# Patient Record
Sex: Male | Born: 1955
Health system: Southern US, Community
[De-identification: ages and names within clinical notes are randomized; demographics above are authoritative.]

## PROBLEM LIST (undated history)

## (undated) DIAGNOSIS — G8929 Other chronic pain: Secondary | ICD-10-CM

## (undated) DIAGNOSIS — R51 Headache: Secondary | ICD-10-CM

## (undated) DIAGNOSIS — G4700 Insomnia, unspecified: Secondary | ICD-10-CM

## (undated) DIAGNOSIS — H9190 Unspecified hearing loss, unspecified ear: Secondary | ICD-10-CM

## (undated) DIAGNOSIS — C61 Malignant neoplasm of prostate: Secondary | ICD-10-CM

## (undated) DIAGNOSIS — M549 Dorsalgia, unspecified: Secondary | ICD-10-CM

## (undated) DIAGNOSIS — M1990 Unspecified osteoarthritis, unspecified site: Secondary | ICD-10-CM

## (undated) HISTORY — PX: FOOT SURGERY: SHX648

## (undated) HISTORY — PX: KNEE ARTHROSCOPY: SHX127

## (undated) HISTORY — PX: MANDIBLE FRACTURE SURGERY: SHX706

## (undated) HISTORY — PX: HERNIA REPAIR: SHX51

## (undated) HISTORY — PX: ELBOW SURGERY: SHX618

## (undated) HISTORY — PX: INSERTION PROSTATE RADIATION SEED: SUR718

---

## 1998-12-05 ENCOUNTER — Emergency Department (HOSPITAL_COMMUNITY): Admission: EM | Admit: 1998-12-05 | Discharge: 1998-12-05 | Payer: Self-pay

## 1999-11-26 ENCOUNTER — Emergency Department (HOSPITAL_COMMUNITY): Admission: EM | Admit: 1999-11-26 | Discharge: 1999-11-26 | Payer: Self-pay | Admitting: Emergency Medicine

## 1999-11-26 ENCOUNTER — Encounter: Payer: Self-pay | Admitting: Emergency Medicine

## 2000-01-24 ENCOUNTER — Ambulatory Visit (HOSPITAL_BASED_OUTPATIENT_CLINIC_OR_DEPARTMENT_OTHER): Admission: RE | Admit: 2000-01-24 | Discharge: 2000-01-24 | Payer: Self-pay | Admitting: Orthopedic Surgery

## 2002-01-27 ENCOUNTER — Encounter: Payer: Self-pay | Admitting: Neurology

## 2002-01-27 ENCOUNTER — Ambulatory Visit (HOSPITAL_COMMUNITY): Admission: RE | Admit: 2002-01-27 | Discharge: 2002-01-27 | Payer: Self-pay | Admitting: Neurology

## 2002-06-10 ENCOUNTER — Encounter: Payer: Self-pay | Admitting: Gastroenterology

## 2002-06-10 ENCOUNTER — Ambulatory Visit (HOSPITAL_COMMUNITY): Admission: RE | Admit: 2002-06-10 | Discharge: 2002-06-10 | Payer: Self-pay | Admitting: Gastroenterology

## 2002-06-18 ENCOUNTER — Emergency Department (HOSPITAL_COMMUNITY): Admission: EM | Admit: 2002-06-18 | Discharge: 2002-06-18 | Payer: Self-pay | Admitting: Emergency Medicine

## 2002-06-18 ENCOUNTER — Encounter: Payer: Self-pay | Admitting: Emergency Medicine

## 2002-07-08 ENCOUNTER — Ambulatory Visit (HOSPITAL_BASED_OUTPATIENT_CLINIC_OR_DEPARTMENT_OTHER): Admission: RE | Admit: 2002-07-08 | Discharge: 2002-07-08 | Payer: Self-pay | Admitting: Orthopedic Surgery

## 2003-08-01 ENCOUNTER — Encounter: Payer: Self-pay | Admitting: Emergency Medicine

## 2003-08-01 ENCOUNTER — Emergency Department (HOSPITAL_COMMUNITY): Admission: EM | Admit: 2003-08-01 | Discharge: 2003-08-01 | Payer: Self-pay | Admitting: Emergency Medicine

## 2003-10-26 ENCOUNTER — Emergency Department (HOSPITAL_COMMUNITY): Admission: EM | Admit: 2003-10-26 | Discharge: 2003-10-26 | Payer: Self-pay | Admitting: Emergency Medicine

## 2004-02-06 ENCOUNTER — Ambulatory Visit (HOSPITAL_COMMUNITY): Admission: RE | Admit: 2004-02-06 | Discharge: 2004-02-06 | Payer: Self-pay | Admitting: Emergency Medicine

## 2004-11-23 ENCOUNTER — Ambulatory Visit (HOSPITAL_COMMUNITY): Admission: RE | Admit: 2004-11-23 | Discharge: 2004-11-23 | Payer: Self-pay | Admitting: Gastroenterology

## 2005-01-04 ENCOUNTER — Encounter: Admission: RE | Admit: 2005-01-04 | Discharge: 2005-01-04 | Payer: Self-pay | Admitting: Neurology

## 2005-01-18 ENCOUNTER — Encounter: Admission: RE | Admit: 2005-01-18 | Discharge: 2005-01-18 | Payer: Self-pay | Admitting: Neurology

## 2005-03-16 ENCOUNTER — Encounter: Admission: RE | Admit: 2005-03-16 | Discharge: 2005-03-16 | Payer: Self-pay | Admitting: Internal Medicine

## 2005-04-09 ENCOUNTER — Emergency Department (HOSPITAL_COMMUNITY): Admission: EM | Admit: 2005-04-09 | Discharge: 2005-04-09 | Payer: Self-pay | Admitting: Emergency Medicine

## 2005-04-10 ENCOUNTER — Other Ambulatory Visit: Admission: RE | Admit: 2005-04-10 | Discharge: 2005-04-10 | Payer: Self-pay | Admitting: General Surgery

## 2006-05-02 ENCOUNTER — Ambulatory Visit: Payer: Self-pay | Admitting: Physical Medicine & Rehabilitation

## 2006-05-02 ENCOUNTER — Encounter
Admission: RE | Admit: 2006-05-02 | Discharge: 2006-07-31 | Payer: Self-pay | Admitting: Physical Medicine & Rehabilitation

## 2006-05-20 ENCOUNTER — Encounter
Admission: RE | Admit: 2006-05-20 | Discharge: 2006-08-18 | Payer: Self-pay | Admitting: Physical Medicine & Rehabilitation

## 2006-10-31 ENCOUNTER — Emergency Department (HOSPITAL_COMMUNITY): Admission: EM | Admit: 2006-10-31 | Discharge: 2006-10-31 | Payer: Self-pay | Admitting: Emergency Medicine

## 2006-11-10 ENCOUNTER — Encounter: Admission: RE | Admit: 2006-11-10 | Discharge: 2006-11-10 | Payer: Self-pay | Admitting: Orthopedic Surgery

## 2007-12-29 ENCOUNTER — Emergency Department (HOSPITAL_COMMUNITY): Admission: EM | Admit: 2007-12-29 | Discharge: 2007-12-29 | Payer: Self-pay | Admitting: Emergency Medicine

## 2008-09-07 ENCOUNTER — Ambulatory Visit: Admission: RE | Admit: 2008-09-07 | Discharge: 2008-09-21 | Payer: Self-pay | Admitting: Radiation Oncology

## 2008-11-10 ENCOUNTER — Ambulatory Visit: Admission: RE | Admit: 2008-11-10 | Discharge: 2009-02-08 | Payer: Self-pay | Admitting: Radiation Oncology

## 2008-11-18 ENCOUNTER — Encounter: Admission: RE | Admit: 2008-11-18 | Discharge: 2008-11-18 | Payer: Self-pay | Admitting: Urology

## 2008-12-20 ENCOUNTER — Ambulatory Visit (HOSPITAL_BASED_OUTPATIENT_CLINIC_OR_DEPARTMENT_OTHER): Admission: RE | Admit: 2008-12-20 | Discharge: 2008-12-20 | Payer: Self-pay | Admitting: Urology

## 2009-03-10 ENCOUNTER — Emergency Department (HOSPITAL_COMMUNITY): Admission: EM | Admit: 2009-03-10 | Discharge: 2009-03-10 | Payer: Self-pay | Admitting: Emergency Medicine

## 2009-11-24 ENCOUNTER — Ambulatory Visit (HOSPITAL_COMMUNITY): Admission: RE | Admit: 2009-11-24 | Discharge: 2009-11-24 | Payer: Self-pay | Admitting: Gastroenterology

## 2009-12-28 ENCOUNTER — Ambulatory Visit (HOSPITAL_BASED_OUTPATIENT_CLINIC_OR_DEPARTMENT_OTHER): Admission: RE | Admit: 2009-12-28 | Discharge: 2009-12-28 | Payer: Self-pay | Admitting: Orthopedic Surgery

## 2010-12-24 ENCOUNTER — Encounter: Payer: Self-pay | Admitting: Neurology

## 2011-02-19 LAB — POCT HEMOGLOBIN-HEMACUE: Hemoglobin: 15.5 g/dL (ref 13.0–17.0)

## 2011-03-19 LAB — COMPREHENSIVE METABOLIC PANEL
ALT: 24 U/L (ref 0–53)
AST: 28 U/L (ref 0–37)
Albumin: 4.3 g/dL (ref 3.5–5.2)
Alkaline Phosphatase: 64 U/L (ref 39–117)
BUN: 9 mg/dL (ref 6–23)
CO2: 30 mEq/L (ref 19–32)
Calcium: 9.6 mg/dL (ref 8.4–10.5)
Chloride: 105 mEq/L (ref 96–112)
Creatinine, Ser: 0.97 mg/dL (ref 0.4–1.5)
GFR calc Af Amer: >60 mL/min (ref >60)
GFR calc non Af Amer: >60 mL/min (ref >60)
Glucose, Bld: 142 mg/dL — ABNORMAL HIGH (ref 70–99)
Potassium: 4.4 mEq/L (ref 3.5–5.1)
Sodium: 140 mEq/L (ref 135–145)
Total Bilirubin: 0.8 mg/dL (ref 0.3–1.2)
Total Protein: 6.5 g/dL (ref 6.0–8.3)

## 2011-03-19 LAB — CBC
HCT: 44.8 % (ref 39.0–52.0)
Hemoglobin: 15.3 g/dL (ref 13.0–17.0)
MCHC: 34.1 g/dL (ref 30.0–36.0)
MCV: 88.6 fL (ref 78.0–100.0)
Platelets: 220 10*3/uL (ref 150–400)
RBC: 5.05 MIL/uL (ref 4.22–5.81)
RDW: 13.9 % (ref 11.5–15.5)
WBC: 5.6 10*3/uL (ref 4.0–10.5)

## 2011-03-19 LAB — PROTIME-INR
INR: 1 (ref 0.00–1.49)
Prothrombin Time: 13.4 seconds (ref 11.6–15.2)

## 2011-03-19 LAB — APTT: aPTT: 30 seconds (ref 24–37)

## 2011-04-17 NOTE — Op Note (Signed)
NAME:  Garrett Wilson, QUAM NO.:  [id number]   MEDICAL RECORD NO.:  [id number]          PATIENT TYPE:  AMB   LOCATION:  [facility]                         FACILITY:  [facility]   PHYSICIAN:  Valetta Fuller, M.D.  DATE OF BIRTH:  04-Dec-1955   DATE OF PROCEDURE:  12/20/2008  DATE OF DISCHARGE:                               OPERATIVE REPORT   PREOPERATIVE DIAGNOSIS:  Favorable clinical stage T1c adenocarcinoma of  the prostate.   POSTOPERATIVE DIAGNOSIS:  Favorable clinical stage T1c adenocarcinoma of  the prostate.   PROCEDURE PERFORMED:  I-125 seed implantation via Nucletron robotic  implanter and flexible cystoscopy.   SURGEON:  Valetta Fuller, M.D.   ASSISTANT:  Maryln Gottron, M.D.   ANESTHESIA:  General.   INDICATIONS:  [name] is a 55 year old male who was originally  diagnosed with favorable adenocarcinoma of the prostate in late 2009.  The patient has a family history of prostate cancer.  The patient's PSA  was only 1.7, but had shown a trend upward.  He was diagnosed with low-  volume Gleason 3 + 3 = 6 cancer.  The patient underwent extensive  consultation with myself and also Maryln Gottron, M.D. about treatment  options which included active surveillance, radiation therapy or robotic  surgery.  After much discussion the patient chose a seed implantation  and appeared to understand the advantages, as well as potential  disadvantages and complications of such treatment.  The patient now  presents for his procedure.  Preoperative voiding symptoms are minimal  with some moderate erectile dysfunction.  Prostate size only about 20  grams.   TECHNIQUE AND FINDINGS:  The patient was brought to the operating room  where he had successful induction of general anesthesia.  He received  perioperative ciprofloxacin.  He was placed in mid lithotomy position.  Transrectal ultrasound probe was inserted with an anchor stand.  Prostate was also anchored with anchoring  needles.  The initial portion  of procedure was performed by radiation oncology to include real-time  contouring of the prostate, urethra and rectum.  A real-time dose was  then established.  The reference dose was 145 gray.  Once the plan was  in place we came into the operating room.  Each needle was placed with  real-time sagittal ultrasound guidance.  Seed implantation was done by  [employer].  A total of 29 needles were utilized with  69 seeds implanted.  The fluoroscopic images after the procedure showed  excellent distribution of the seeds.  The patient's Foley catheter was  removed and flexible cystoscopy was performed.  No seeds were noted in  the urethra or bladder.  A new Foley catheter was placed and the patient  was brought to recovery room in stable condition.      Valetta Fuller, M.D.  Electronically Signed    DSG/MEDQ  D:  12/21/2008  T:  12/21/2008  Job:  [id number]

## 2011-04-20 NOTE — Procedures (Signed)
[facility]  Patient:    [name], [name] Visit Number: [id number] MRN: [id number]          Service Type: END Location: ENDO Attending Physician:  Dennison Bulla Ii Dictated by:   Verlin Grills, M.D. Proc. Date: 06/10/02 Admit Date:  06/10/2002 Discharge Date: 06/10/2002   CC:         Thora Lance, M.D.                           Procedure Report  PROCEDURE:  Esophagogastroduodenoscopy.  REFERRING PHYSICIAN:  Thora Lance, M.D.  INDICATION FOR PROCEDURE:  Garrett Wilson is a 55 year old male born 1956/06/03. Garrett Wilson is undergoing diagnostic esophagogastroduodenoscopy with possible Savary esophageal dilation to evaluate dysphagia.  ENDOSCOPIST:  Verlin Grills, M.D.  PREMEDICATION:  Versed 10 mg, Demerol 50 mg.  ENDOSCOPE:  Olympus gastroscope.  DESCRIPTION OF PROCEDURE:  After obtaining informed consent, Garrett Wilson was placed in the left lateral decubitus position. I administered intravenous Demerol and intravenous Versed to achieve conscious sedation for the procedure. The patients cardiac rhythm, oxygen saturation and blood pressure were monitored throughout the procedure and documented in the medical record.  The Olympus gastroscope was passed through the posterior hypopharynx into the proximal esophagus without difficulty. The hypopharynx, larynx, and vocal chords appeared normal.  ESOPHAGOSCOPY:  The proximal, mid and lower segments of the esophagus appeared normal. Endoscopically there is no evidence for the presence of esophageal mucosal scarring, Barretts esophagus, erosive esophagitis, esophageal stricture formation, esophageal obstruction or esophageal ulceration.  GASTROSCOPY:  Garrett Wilson does have a small hiatal hernia. Retroflexed view of the gastric cardia and fundus was normal. The diaphragmatic hiatus is moderately patulous. The gastric body, antrum and pylorus appeared  normal.  DUODENOSCOPY:  The duodenal bulb and descending duodenum appeared normal.  ASSESSMENT:  Small hiatal hernia with moderately patulous diaphragmatic hiatus; no signs of esophageal mucosal injury, Barretts esophagus, or esophageal stricture formation.  RECOMMENDATIONS:  I suspect Garrett Wilson has esophageal dismotility causing his intermittent dysphagia. I recommend to him to try proton pump inhibitor therapy on a daily basis to determine if acid reflux is contributing to his probable esophageal dismotility. Dictated by:   Verlin Grills, M.D. Attending Physician:  Dennison Bulla Ii DD:  06/10/02 TD:  06/13/02 Job: [id number]
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
## 2011-04-20 NOTE — Op Note (Signed)
[place]  Patient:    Garrett Wilson, Garrett Wilson                   MRN: [id number] Proc. Date: 01/24/00 Adm. Date:  [id number] Attending:  Marlowe Shores                           Operative Report  PREOPERATIVE DIAGNOSIS:  Right elbow loose body.  POSTOPERATIVE DIAGNOSIS:  Right elbow loose body.  PROCEDURE:  Right elbow arthroscopy with removal of loose body and abrasion chondroplasty articular defect radial head.  SURGEON:  Artist Pais. Mina Marble, M.D.  ASSISTANT:  Nicki Reaper, M.D.  ANESTHESIA:  General.  TOURNIQUET TIME:  1 hour and 28 minutes.  COMPLICATIONS:  None.  DRAINS:  None.  OPERATIVE REPORT:  Patient was taken to operating room.  After the induction of  adequate general anesthesia, was placed prone on the operating room bed and his  right arm was then placed in the elbow arthroscopy positioner with bony prominences well-padded and then his right upper extremity was prepped and draped in usual sterile fashion.  Esmarch was used to exsanguinate the limb and the tourniquet as inflated to 250 mmHg.  At this point and time, a standard medial arthroscopic portal was established after 20 cc of normal saline was injected through the lateral aspect of the elbow joint.  A 15 blade was used to incise the skin 2 cm  proximal to the medial epicondyle anterior to the intermuscular septum and the trocar was then felt to skid along the anterior aspect of the humerus until the  joint was penetrated.  The scope was then introduced into the joint and visualization of the joint showed the anterior aspect of the radial head, the trochlea and the capitellum.  Inspection of the radial head showed a large articular defect with soft cartilage approximately 1 cm in length and 1 cm in diameter along the anterior lip.  A standard lateral working portal was established using an inside-out technique, the scope was placed to the anterior aspect  of the capsule anterior to the radial head and then using a switching stick, it was pushed through the capsule and out through the skin, a small blade was used to incise he skin, switching stick was then drawn through the lateral skin and a small lateral working portal was established.  The 3.5 suction shaver was then placed into the joint from the lateral working portal and a synovectomy was performed along the  gutter along the radial head and neck.  Next, the articular defect that had been previously identified was debrided down to a cancellous base, which was decorticated to bleeding bone and using a 4.0 suction shaver. At this point in time, the Arthrocare wand was introduced through the lateral portal for streaking and ablation of hypertrophic synovium.  Next, an anterior olecranon-type portal was established lateral and in the area of the olecranon fossa using an 18-gauge needle, followed by a 15 blade to incise the skin.  The scope was inserted into  this area and visualization of the posterior aspect of the joint showed a large  loose body.  The loose body was identified through the skin with 22-gauge needle and then removed through a separate stab incision using a small grasping forcep. The wounds were thoroughly irrigated.  The portals were then closed with 4-0 nylon and 15 cc of 0.25% Marcaine with epinephrine was injected  in the joint for postoperative pain control.  Sterile dressing of Xeroform, 4 x 4s, fluffs and a  compressive wrap was applied.  The patient tolerated the procedure well and went to recovery room in stable fashion. DD:  01/24/00 TD:  01/24/00 Job: [id number]
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
## 2011-04-20 NOTE — Op Note (Signed)
NAME:  Garrett Wilson, Garrett Wilson NO.:  [id number]   MEDICAL RECORD NO.:  [id number]                    PATIENT TYPE:   LOCATION:                                       FACILITY:   PHYSICIAN:  Harvie Junior, M.D.                DATE OF BIRTH:   DATE OF PROCEDURE:  07/08/2002  DATE OF DISCHARGE:                                 OPERATIVE REPORT   PREOPERATIVE DIAGNOSIS:  Degenerative joint disease with questionable  anterior cruciate ligament tear.   POSTOPERATIVE DIAGNOSIS:  Degenerative joint disease with questionable  anterior cruciate ligament tear.   PROCEDURES:  1. Examination under anesthesia and arthroscopy.  2. Debridement of lateral femoral condyle with partial lateral meniscectomy.  3. Debridement of chondromalacia of patella and femoral trochlea.   SURGEON:  Harvie Junior, M.D.   ASSISTANT:  Currie Paris. Thedore Mins.   ANESTHESIA:  General.   BRIEF HISTORY:  A 55 year old male with a long history of having  degenerative joint disease bilaterally.  He ultimately had a previous  arthroscopy which showed that he had some degenerative joint disease and a  known ACL tear.  We discussed preoperatively the possibility of doing an ACL  reconstruction but ultimately felt that we would examine him under  anesthesia to know what the appropriate course of action was.  The patient  was brought to the operating room for evaluation and this procedure.   DESCRIPTION OF PROCEDURE:  The patient was brought to the operating room and  after adequate anesthesia was obtained with a general anesthetic, the  patient was placed upon the operating table.  The right leg was then  examined under anesthesia.  He had about a 10 degree flexion contracture on  that side, and this did not resolve under anesthesia.  We tried to push him  into full extension and he really could not go straight.  It was clear at  that point that he did have a positive Lachman and because of his  positive  Lachman, I did send for the ACL graft although I told them not to open it.  He did not have a pivot shift, I think mostly because he could not be  brought into full extension.  My concern was that if he had something that  was locking the knee and we removed this impediment, that he may then start  to have a positive pivot shift.  At any rate, at this time the patient was  prepped and draped and a normal arthroscopy was performed.  At arthroscopy  he did have some grade 2 and 3 changes in the patellofemoral joint under the  patella and the femoral trochlea.  This was debrided.  We then turned to the  medial side, where he was noted to have an old what looked like a bucket  handle resection with a little bit of fray of the posterior horn  medial  meniscus, not dramatic.  The medial femoral condyle showed maybe some grade  1 change but nothing significant.  The anterior cruciate ligament was  present although attenuated and did under Lachman testing tighten, but it  was certainly a very weak presentation.  At that point it was still unclear  exactly what we would do, and we ultimately felt that we went over laterally  to see what was going on there.  The lateral tibial plateau had some grade 2  change and finally we did find some significant grade 4 change on the  lateral femoral condyle.  This was over a large area, probably 4 x 4 sq. cm  area.  Given the significant degenerative change in the lateral compartment,  the patient's age of 55 years, and the really inability to pivot shift this  patient under anesthesia, it was felt that ACL reconstruction was not  appropriate at this point and it was not undertaken.  The knee was copiously  irrigated.  All  loose and fragmented pieces were debrided.  At this point the knee was  copiously irrigated and suctioned dry.  A sterile compressive dressing was  applied and the patient taken to recovery, where he was noted to be in  satisfactory  condition.  Estimated blood loss for the procedure was none.                                               Harvie Junior, M.D.    Ranae Plumber  D:  07/08/2002  T:  07/12/2002  Job:  [id number]

## 2011-04-20 NOTE — Op Note (Signed)
NAME:  MANJOT, HINKS NO.:  [id number]   MEDICAL RECORD NO.:  [id number]          PATIENT TYPE:  AMB   LOCATION:  ENDO                         FACILITY:  [facility]   PHYSICIAN:  Danise Edge, M.D.   DATE OF BIRTH:  Aug 27, 1956   DATE OF PROCEDURE:  11/23/2004  DATE OF DISCHARGE:                                 OPERATIVE REPORT   PROCEDURE:  Screening colonoscopy.   INDICATIONS FOR PROCEDURE:  Mr. Rusty Villella is a 55 year old male born  30-Sep-1956.  Mr. Musial is scheduled to undergo his first  screening colonoscopy with polypectomy to prevent colon cancer.   ENDOSCOPIST:  Danise Edge, M.D.   PREMEDICATION:  Versed 10 mg, Demerol 50 mg.   DESCRIPTION OF PROCEDURE:  After obtaining informed consent, Mr. Heaphy  was placed in the left lateral decubitus position.  I administered  intravenous Demerol and intravenous Versed to achieve conscious sedation for  the procedure.  The patient's blood pressure, oxygen saturation, and cardiac  rhythm were monitored throughout the procedure and documented in the medical  record.   Anal inspection and digital rectal exam were normal.  The prostate was  nonnodular.  The Olympus adjustable pediatric colonoscope was introduced  into the rectum and advanced to the cecum.  The colonic preparation for the  exam today was excellent.   Rectum:  Normal.   Sigmoid colon and descending colon:  Normal.   Splenic flexure:  Normal.   Transverse colon:  Normal.   Hepatic flexure:  Normal.   Ascending colon:  Normal.   Cecum and ileocecal valve:  Normal.   ASSESSMENT:  Normal screening proctocolonoscopy to the cecum.      MJ/MEDQ  D:  11/23/2004  T:  11/23/2004  Job:  [id number]   cc:   Georgann Housekeeper, MD  [address]  [place]  [place] [address]  Fax: [phone]   Kerry Kass, M.D. [facility]

## 2011-06-19 ENCOUNTER — Emergency Department (HOSPITAL_BASED_OUTPATIENT_CLINIC_OR_DEPARTMENT_OTHER)
Admission: EM | Admit: 2011-06-19 | Discharge: 2011-06-19 | Disposition: A | Discharge status: Home / Self Care | Payer: No Typology Code available for payment source | Attending: Emergency Medicine | Admitting: Emergency Medicine

## 2011-06-19 ENCOUNTER — Encounter: Payer: Self-pay | Admitting: Student

## 2011-06-19 DIAGNOSIS — M546 Pain in thoracic spine: Secondary | ICD-10-CM | POA: Insufficient documentation

## 2011-06-19 DIAGNOSIS — R51 Headache: Secondary | ICD-10-CM | POA: Insufficient documentation

## 2011-06-19 DIAGNOSIS — S335XXA Sprain of ligaments of lumbar spine, initial encounter: Secondary | ICD-10-CM | POA: Insufficient documentation

## 2011-06-19 DIAGNOSIS — M545 Low back pain, unspecified: Secondary | ICD-10-CM | POA: Insufficient documentation

## 2011-06-19 DIAGNOSIS — Z8546 Personal history of malignant neoplasm of prostate: Secondary | ICD-10-CM | POA: Insufficient documentation

## 2011-06-19 DIAGNOSIS — S39012A Strain of muscle, fascia and tendon of lower back, initial encounter: Secondary | ICD-10-CM

## 2011-06-19 DIAGNOSIS — Y92410 Unspecified street and highway as the place of occurrence of the external cause: Secondary | ICD-10-CM | POA: Insufficient documentation

## 2011-06-19 HISTORY — DX: Insomnia, unspecified: G47.00

## 2011-06-19 HISTORY — DX: Malignant neoplasm of prostate: C61

## 2011-06-19 MED ORDER — IBUPROFEN 800 MG PO TABS: 800.0000 mg | ORAL_TABLET | Freq: Three times a day (TID) | ORAL | Status: AC

## 2011-06-19 MED ORDER — IBUPROFEN 800 MG PO TABS: 800.0000 mg | ORAL_TABLET | Freq: Three times a day (TID) | ORAL | Status: DC

## 2011-06-19 MED ORDER — IBUPROFEN 800 MG PO TABS
800.0000 mg | ORAL_TABLET | Freq: Once | ORAL | Status: AC
  Administered 2011-06-19: 800 mg via ORAL
  Filled 2011-06-19: qty 1
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
## 2011-06-21 NOTE — ED Provider Notes (Signed)
Evaluation and management procedures were performed by the PA/NP under my supervision/collaboration.   Dione Booze, MD 06/21/11 [phone]

## 2012-03-17 DIAGNOSIS — L723 Sebaceous cyst: Secondary | ICD-10-CM | POA: Diagnosis not present

## 2012-05-19 DIAGNOSIS — C61 Malignant neoplasm of prostate: Secondary | ICD-10-CM | POA: Diagnosis not present

## 2012-05-20 DIAGNOSIS — C61 Malignant neoplasm of prostate: Secondary | ICD-10-CM | POA: Diagnosis not present

## 2012-05-20 DIAGNOSIS — N529 Male erectile dysfunction, unspecified: Secondary | ICD-10-CM | POA: Diagnosis not present

## 2012-05-20 DIAGNOSIS — M6740 Ganglion, unspecified site: Secondary | ICD-10-CM | POA: Diagnosis not present

## 2012-06-11 DIAGNOSIS — M549 Dorsalgia, unspecified: Secondary | ICD-10-CM | POA: Diagnosis not present

## 2012-06-11 DIAGNOSIS — Z79899 Other long term (current) drug therapy: Secondary | ICD-10-CM | POA: Diagnosis not present

## 2012-06-12 DIAGNOSIS — M25539 Pain in unspecified wrist: Secondary | ICD-10-CM | POA: Diagnosis not present

## 2012-06-16 ENCOUNTER — Encounter (HOSPITAL_BASED_OUTPATIENT_CLINIC_OR_DEPARTMENT_OTHER): Payer: Self-pay | Admitting: *Deleted

## 2012-06-16 NOTE — Progress Notes (Signed)
No ht or resp problems No lab needed

## 2012-06-18 ENCOUNTER — Ambulatory Visit (HOSPITAL_BASED_OUTPATIENT_CLINIC_OR_DEPARTMENT_OTHER): Payer: Medicare Other | Admitting: Anesthesiology

## 2012-06-18 ENCOUNTER — Encounter (HOSPITAL_BASED_OUTPATIENT_CLINIC_OR_DEPARTMENT_OTHER): Payer: Self-pay | Admitting: Anesthesiology

## 2012-06-18 ENCOUNTER — Ambulatory Visit (HOSPITAL_BASED_OUTPATIENT_CLINIC_OR_DEPARTMENT_OTHER)
Admission: RE | Admit: 2012-06-18 | Discharge: 2012-06-18 | Disposition: A | Discharge status: Home / Self Care | Payer: Medicare Other | Source: Ambulatory Visit | Attending: Orthopedic Surgery | Admitting: Orthopedic Surgery

## 2012-06-18 ENCOUNTER — Encounter (HOSPITAL_BASED_OUTPATIENT_CLINIC_OR_DEPARTMENT_OTHER)
Admission: RE | Disposition: A | Discharge status: Home / Self Care | Payer: Self-pay | Source: Ambulatory Visit | Attending: Orthopedic Surgery

## 2012-06-18 DIAGNOSIS — M6740 Ganglion, unspecified site: Secondary | ICD-10-CM | POA: Diagnosis not present

## 2012-06-18 DIAGNOSIS — Z8546 Personal history of malignant neoplasm of prostate: Secondary | ICD-10-CM | POA: Insufficient documentation

## 2012-06-18 HISTORY — DX: Other chronic pain: G89.29

## 2012-06-18 HISTORY — DX: Dorsalgia, unspecified: M54.9

## 2012-06-18 HISTORY — DX: Unspecified hearing loss, unspecified ear: H91.90

## 2012-06-18 HISTORY — PX: GANGLION CYST EXCISION: SHX1691

## 2012-06-18 HISTORY — DX: Headache: R51

## 2012-06-18 HISTORY — DX: Unspecified osteoarthritis, unspecified site: M19.90

## 2012-06-18 SURGERY — EXCISION, GANGLION CYST, WRIST
Anesthesia: General | Site: Wrist | Laterality: Right | Wound class: Clean

## 2012-06-18 MED ORDER — HYDROCODONE-ACETAMINOPHEN 5-500 MG PO TABS: 1.0000 | ORAL_TABLET | Freq: Four times a day (QID) | ORAL | Status: AC | PRN

## 2012-06-18 MED ORDER — MIDAZOLAM HCL 5 MG/5ML IJ SOLN
INTRAMUSCULAR | Status: DC | PRN
  Administered 2012-06-18: 2 mg via INTRAVENOUS

## 2012-06-18 MED ORDER — CEFAZOLIN SODIUM 1-5 GM-% IV SOLN
INTRAVENOUS | Status: DC | PRN
  Administered 2012-06-18: 1 g via INTRAVENOUS

## 2012-06-18 MED ORDER — METOCLOPRAMIDE HCL 5 MG/ML IJ SOLN: 10.0000 mg | Freq: Once | INTRAMUSCULAR | Status: DC | PRN

## 2012-06-18 MED ORDER — LACTATED RINGERS IV SOLN
INTRAVENOUS | Status: DC
  Administered 2012-06-18 (×2): via INTRAVENOUS

## 2012-06-18 MED ORDER — BUPIVACAINE HCL (PF) 0.5 % IJ SOLN
INTRAMUSCULAR | Status: DC | PRN
  Administered 2012-06-18: 6 mL

## 2012-06-18 MED ORDER — DEXAMETHASONE SODIUM PHOSPHATE 4 MG/ML IJ SOLN
INTRAMUSCULAR | Status: DC | PRN
  Administered 2012-06-18: 10 mg via INTRAVENOUS

## 2012-06-18 MED ORDER — FENTANYL CITRATE 0.05 MG/ML IJ SOLN
INTRAMUSCULAR | Status: DC | PRN
  Administered 2012-06-18: 250 ug via INTRAVENOUS
  Administered 2012-06-18: 50 ug via INTRAVENOUS

## 2012-06-18 MED ORDER — METOCLOPRAMIDE HCL 5 MG/ML IJ SOLN
INTRAMUSCULAR | Status: DC | PRN
  Administered 2012-06-18: 10 mg via INTRAVENOUS

## 2012-06-18 MED ORDER — OXYCODONE HCL 5 MG PO TABS: 5.0000 mg | ORAL_TABLET | Freq: Once | ORAL | Status: DC | PRN

## 2012-06-18 MED ORDER — FENTANYL CITRATE 0.05 MG/ML IJ SOLN: 25.0000 ug | INTRAMUSCULAR | Status: DC | PRN

## 2012-06-18 SURGICAL SUPPLY — 55 items
APL SKNCLS STERI-STRIP NONHPOA (GAUZE/BANDAGES/DRESSINGS) ×1
BANDAGE ELASTIC 3 VELCRO ST LF (GAUZE/BANDAGES/DRESSINGS) ×2 IMPLANT
BANDAGE GAUZE ELAST BULKY 4 IN (GAUZE/BANDAGES/DRESSINGS) IMPLANT
BANDAGE GAUZE STRT 1 STR LF (GAUZE/BANDAGES/DRESSINGS) IMPLANT
BENZOIN TINCTURE PRP APPL 2/3 (GAUZE/BANDAGES/DRESSINGS) ×1 IMPLANT
BLADE MINI RND TIP GREEN BEAV (BLADE) IMPLANT
BLADE SURG 15 STRL LF DISP TIS (BLADE) ×1 IMPLANT
BLADE SURG 15 STRL SS (BLADE) ×2
BNDG CMPR 9X4 STRL LF SNTH (GAUZE/BANDAGES/DRESSINGS)
BNDG ESMARK 4X9 LF (GAUZE/BANDAGES/DRESSINGS) IMPLANT
CANISTER SUCTION 1200CC (MISCELLANEOUS) IMPLANT
CLOTH BEACON ORANGE TIMEOUT ST (SAFETY) ×2 IMPLANT
COVER MAYO STAND STRL (DRAPES) ×2 IMPLANT
COVER TABLE BACK 60X90 (DRAPES) ×2 IMPLANT
CUFF TOURNIQUET SINGLE 18IN (TOURNIQUET CUFF) ×1 IMPLANT
DECANTER SPIKE VIAL GLASS SM (MISCELLANEOUS) IMPLANT
DRAPE EXTREMITY T 121X128X90 (DRAPE) ×2 IMPLANT
DRSG EMULSION OIL 3X3 NADH (GAUZE/BANDAGES/DRESSINGS) ×1 IMPLANT
DURAPREP 26ML APPLICATOR (WOUND CARE) ×2 IMPLANT
ELECT NDL TIP 2.8 STRL (NEEDLE) IMPLANT
ELECT NEEDLE TIP 2.8 STRL (NEEDLE) ×2 IMPLANT
ELECT REM PT RETURN 9FT ADLT (ELECTROSURGICAL) ×2
ELECTRODE REM PT RTRN 9FT ADLT (ELECTROSURGICAL) IMPLANT
GLOVE BIOGEL PI IND STRL 8 (GLOVE) ×2 IMPLANT
GLOVE BIOGEL PI INDICATOR 8 (GLOVE) ×2
GLOVE ECLIPSE 7.5 STRL STRAW (GLOVE) ×4 IMPLANT
GLOVE SKINSENSE NS SZ7.0 (GLOVE) ×1
GLOVE SKINSENSE STRL SZ7.0 (GLOVE) IMPLANT
GOWN BRE IMP PREV XXLGXLNG (GOWN DISPOSABLE) ×1 IMPLANT
GOWN PREVENTION PLUS XLARGE (GOWN DISPOSABLE) ×3 IMPLANT
GOWN PREVENTION PLUS XXLARGE (GOWN DISPOSABLE) ×2 IMPLANT
NDL HYPO 25X1 1.5 SAFETY (NEEDLE) IMPLANT
NEEDLE HYPO 25X1 1.5 SAFETY (NEEDLE) ×2 IMPLANT
NS IRRIG 1000ML POUR BTL (IV SOLUTION) ×2 IMPLANT
PACK BASIN DAY SURGERY FS (CUSTOM PROCEDURE TRAY) ×2 IMPLANT
PAD CAST 3X4 CTTN HI CHSV (CAST SUPPLIES) ×1 IMPLANT
PADDING CAST ABS 4INX4YD NS (CAST SUPPLIES) ×1
PADDING CAST ABS COTTON 4X4 ST (CAST SUPPLIES) ×1 IMPLANT
PADDING CAST COTTON 3X4 STRL (CAST SUPPLIES) ×2
PENCIL BUTTON HOLSTER BLD 10FT (ELECTRODE) ×1 IMPLANT
SPLINT PLASTER CAST XFAST 3X15 (CAST SUPPLIES) IMPLANT
SPLINT PLASTER XTRA FASTSET 3X (CAST SUPPLIES) ×5
SPONGE GAUZE 4X4 12PLY (GAUZE/BANDAGES/DRESSINGS) ×2 IMPLANT
STOCKINETTE 4X48 STRL (DRAPES) ×2 IMPLANT
STRIP CLOSURE SKIN 1/2X4 (GAUZE/BANDAGES/DRESSINGS) ×1 IMPLANT
SUCTION FRAZIER TIP 10 FR DISP (SUCTIONS) IMPLANT
SUT ETHILON 4 0 P 3 18 (SUTURE) ×1 IMPLANT
SUT MON AB 4-0 PC3 18 (SUTURE) ×2 IMPLANT
SYR BULB 3OZ (MISCELLANEOUS) ×2 IMPLANT
SYR CONTROL 10ML LL (SYRINGE) ×1 IMPLANT
TOWEL OR 17X24 6PK STRL BLUE (TOWEL DISPOSABLE) ×4 IMPLANT
TOWEL OR NON WOVEN STRL DISP B (DISPOSABLE) ×2 IMPLANT
TUBE CONNECTING 20X1/4 (TUBING) IMPLANT
UNDERPAD 30X30 INCONTINENT (UNDERPADS AND DIAPERS) ×2 IMPLANT
WATER STERILE IRR 1000ML POUR (IV SOLUTION) ×1 IMPLANT

## 2012-06-18 NOTE — Anesthesia Preprocedure Evaluation (Signed)
Anesthesia Evaluation  Patient identified by MRN, date of birth, ID band Patient awake    Reviewed: Allergy & Precautions, H&P , NPO status , Patient's Chart, lab work & pertinent test results, reviewed documented beta blocker date and time   Airway Mallampati: II TM Distance: >3 FB Neck ROM: full    Dental   Pulmonary neg pulmonary ROS,          Cardiovascular negative cardio ROS      Neuro/Psych  Headaches, negative psych ROS   GI/Hepatic negative GI ROS, Neg liver ROS,   Endo/Other  negative endocrine ROS  Renal/GU negative Renal ROS  negative genitourinary   Musculoskeletal   Abdominal   Peds  Hematology negative hematology ROS (+)   Anesthesia Other Findings See surgeon's H&P   Reproductive/Obstetrics negative OB ROS                           Anesthesia Physical Anesthesia Plan  ASA: II  Anesthesia Plan: General   Post-op Pain Management:    Induction: Intravenous  Airway Management Planned: LMA  Additional Equipment:   Intra-op Plan:   Post-operative Plan: Extubation in OR  Informed Consent: I have reviewed the patients History and Physical, chart, labs and discussed the procedure including the risks, benefits and alternatives for the proposed anesthesia with the patient or authorized representative who has indicated his/her understanding and acceptance.   Dental Advisory Given  Plan Discussed with: CRNA and Surgeon  Anesthesia Plan Comments:         Anesthesia Quick Evaluation

## 2012-06-18 NOTE — Anesthesia Procedure Notes (Signed)
Procedure Name: LMA Insertion Date/Time: 06/18/2012 1:18 PM Performed by: Gar Gibbon Pre-anesthesia Checklist: Patient identified, Emergency Drugs available, Suction available and Patient being monitored Patient Re-evaluated:Patient Re-evaluated prior to inductionOxygen Delivery Method: Circle System Utilized Preoxygenation: Pre-oxygenation with 100% oxygen Intubation Type: IV induction Ventilation: Mask ventilation without difficulty LMA: LMA inserted LMA Size: 5.0 Number of attempts: 1 Airway Equipment and Method: bite block Placement Confirmation: positive ETCO2 Tube secured with: Tape Dental Injury: Teeth and Oropharynx as per pre-operative assessment

## 2012-06-18 NOTE — Brief Op Note (Signed)
06/18/2012  2:50 PM  PATIENT:  Garrett Wilson  56 y.o. male  PRE-OPERATIVE DIAGNOSIS:  ganglion cyst right wrist  POST-OPERATIVE DIAGNOSIS:  ganglion cyst right wrist  PROCEDURE:  Procedure(s) (LRB): REMOVAL GANGLION OF WRIST (Right)  SURGEON:  Surgeon(s) and Role:    * Harvie Junior, MD - Primary  PHYSICIAN ASSISTANT:   ASSISTANTS: bethune   ANESTHESIA:   general  EBL:  Total I/O In: 1000 [I.V.:1000] Out: -   BLOOD ADMINISTERED:none  DRAINS: none   LOCAL MEDICATIONS USED:  MARCAINE     SPECIMEN:  No Specimen  DISPOSITION OF SPECIMEN:  N/A  COUNTS:  YES  TOURNIQUET:   Total Tourniquet Time Documented: Upper Arm (Right) - 11 minutes  DICTATION: .Other Dictation: Dictation Number [id number]  PLAN OF CARE: Discharge to home after PACU  PATIENT DISPOSITION:  PACU - hemodynamically stable.   Delay start of Pharmacological VTE agent (>24hrs) due to surgical blood loss or risk of bleeding: not applicable

## 2012-06-18 NOTE — Transfer of Care (Signed)
Immediate Anesthesia Transfer of Care Note  Patient: Garrett Wilson  Procedure(s) Performed: Procedure(s) (LRB): REMOVAL GANGLION OF WRIST (Right)  Patient Location: PACU  Anesthesia Type: General  Level of Consciousness: sedated  Airway & Oxygen Therapy: Patient Spontanous Breathing and Patient connected to face mask oxygen  Post-op Assessment: Report given to PACU RN and Post -op Vital signs reviewed and stable  Post vital signs: Reviewed and stable  Complications: No apparent anesthesia complications

## 2012-06-18 NOTE — H&P (Signed)
PREOPERATIVE H&P  Chief Complaint: r wrist pain  HPI: Garrett Wilson is a 56 y.o. male who presents for evaluation of R. Wrist pain. It has been present for greater than 6 mon and has been worsening. He has failed conservative measures. Pain is rated as moderate.  Past Medical History  Diagnosis Date  . Insomnia   . Prostate cancer     2010  . Arthritis   . Back pain, chronic   . HOH (hard of hearing)   . Headache    Past Surgical History  Procedure Date  . Foot surgery     fx rt 2nd toe  . Knee arthroscopy     right and left knee scoped  . Elbow surgery     Right and Left Elbow  . Mandible fracture surgery     fx lt cheek  . Hernia repair     Esophageal Stretching  . Insertion prostate radiation seed    History   Social History  . Marital Status: Married    Spouse Name: N/A    Number of Children: N/A  . Years of Education: N/A   Social History Main Topics  . Smoking status: Never Smoker   . Smokeless tobacco: Not on file  . Alcohol Use: No  . Drug Use: No  . Sexually Active:    Other Topics Concern  . Not on file   Social History Narrative  . No narrative on file   No family history on file. No Known Allergies Prior to Admission medications   Medication Sig Start Date End Date Taking? Authorizing Provider  Cyanocobalamin (VITAMIN B-12) 2500 MCG SUBL Place 1 tablet under the tongue daily.     Yes Historical Provider, MD  Garlic 1000 MG CAPS Take 2 capsules by mouth daily.     Yes Historical Provider, MD  Omega-3 Fatty Acids (FISH OIL) 1000 MG CAPS Take 2 capsules by mouth daily.     Yes Historical Provider, MD  OVER THE COUNTER MEDICATION Take 1 packet by mouth daily. Multivitamin Packet    Yes Historical Provider, MD     Positive ROS: none  All other systems have been reviewed and were otherwise negative with the exception of those mentioned in the HPI and as above.  Physical Exam: Filed Vitals:   06/18/12 1201  BP: 150/88  Pulse: 59  Temp:  98.4 F (36.9 C)  Resp: 16    General: Alert, no acute distress Cardiovascular: No pedal edema Respiratory: No cyanosis, no use of accessory musculature GI: No organomegaly, abdomen is soft and non-tender Skin: No lesions in the area of chief complaint Neurologic: Sensation intact distally Psychiatric: Patient is competent for consent with normal mood and affect Lymphatic: No axillary or cervical lymphadenopathy  MUSCULOSKELETAL: 1x1 cm cyst over wrist.  Pain on ROM  Assessment/Plan: ganglion cyst right wrist Plan for Procedure(s): REMOVAL GANGLION OF WRIST  The risks benefits and alternatives were discussed with the patient including but not limited to the risks of nonoperative treatment, versus surgical intervention including infection, bleeding, nerve injury, malunion, nonunion, hardware prominence, hardware failure, need for hardware removal, blood clots, cardiopulmonary complications, morbidity, mortality, among others, and they were willing to proceed.  Predicted outcome is good, although there will be at least a six to nine month expected recovery.  GRAVES,JOHN L, MD 06/18/2012 1:05 PM

## 2012-06-18 NOTE — Anesthesia Postprocedure Evaluation (Signed)
Anesthesia Post Note  Patient: Garrett Wilson  Procedure(s) Performed: Procedure(s) (LRB): REMOVAL GANGLION OF WRIST (Right)  Anesthesia type: General  Patient location: PACU  Post pain: Pain level controlled  Post assessment: Patient's Cardiovascular Status Stable  Last Vitals:  Filed Vitals:   06/18/12 1514  BP: 131/68  Pulse: 61  Temp: 36.5 C  Resp: 16    Post vital signs: Reviewed and stable  Level of consciousness: alert  Complications: No apparent anesthesia complications

## 2012-06-19 LAB — POCT HEMOGLOBIN-HEMACUE: Hemoglobin: 13.9 g/dL (ref 13.0–17.0)

## 2012-06-19 NOTE — Op Note (Signed)
NAME:  Garrett Wilson, Garrett Wilson NO.:  [id number]  MEDICAL RECORD NO.:  [id number]  LOCATION:                                 FACILITY:  PHYSICIAN:  Harvie Junior, M.D.        DATE OF BIRTH:  DATE OF PROCEDURE:  06/18/2012 DATE OF DISCHARGE:                              OPERATIVE REPORT   PREOPERATIVE DIAGNOSIS:  Volar ganglion cyst, right side.  POSTOPERATIVE DIAGNOSIS:  Volar ganglion cyst, right side.  PROCEDURE:  Excision of volar ganglion cyst.  SURGEON:  Harvie Junior, MD  ASSISTANT:  Marshia Ly, PA  ANESTHESIA:  General.  BRIEF HISTORY:  Garrett Wilson is a 56 year old male with a history of having significant right volar ganglion cyst.  We treated him conservatively for a period of time.  Because of failure of all conservative care, he is taken to the operating room for cyst excision. We had aspirated in the office previously and noted it to be a ganglion cyst.  He came to the operating room for excision of the cyst.  DESCRIPTION OF PROCEDURE:  The patient came to the operating room. After adequate anesthesia was obtained with general anesthetic, the patient was placed supine on the operating table.  The right arm was prepped and draped in usual sterile fashion.  Following this, a curved incision was made over the distal aspect of the wrist.  After exsanguination of extremity, blood pressure tourniquet inflated 250 mmHg.  After incision was made, subcutaneous tissue down the level of the volar ganglion cyst was clearly identified.  Care was taken to dissect nerves free off the top part of the cyst and then the cyst was clearly identified.  I really followed the FCR tendon.  We got down to the FCR tendon, opened the sheath, took the tendon radially and then tracked the cyst all the way right down to the volar wrist capsule. Volar wrist capsule was opened and cauterized.  The cyst was excised completely.  At this point, the tourniquet was let down.  All  bleeding was controlled with electrocautery.  The wound was copiously irrigated and suctioned dry.  Sterile compressive dressings was applied after the wound was closed with Monocryl subcuticular sutures.  Benzoin and Steri-Strips were applied.  Sterile compressive dressing was applied.  The patient was taken to the recovery room and was noted to be in a satisfactory condition.  Estimated blood loss for the procedure was none.     Harvie Junior, M.D.     Ranae Plumber  D:  06/18/2012  T:  06/19/2012  Job:  [id number]

## 2012-06-20 ENCOUNTER — Encounter (HOSPITAL_BASED_OUTPATIENT_CLINIC_OR_DEPARTMENT_OTHER): Payer: Self-pay | Admitting: Orthopedic Surgery

## 2012-07-02 DIAGNOSIS — E785 Hyperlipidemia, unspecified: Secondary | ICD-10-CM | POA: Diagnosis not present

## 2012-07-02 DIAGNOSIS — Z1331 Encounter for screening for depression: Secondary | ICD-10-CM | POA: Diagnosis not present

## 2012-07-02 DIAGNOSIS — Z0000 Encounter for general adult medical examination without abnormal findings: Secondary | ICD-10-CM | POA: Diagnosis not present

## 2013-01-22 DIAGNOSIS — C61 Malignant neoplasm of prostate: Secondary | ICD-10-CM | POA: Diagnosis not present

## 2013-04-10 DIAGNOSIS — N529 Male erectile dysfunction, unspecified: Secondary | ICD-10-CM | POA: Diagnosis not present

## 2013-04-10 DIAGNOSIS — C61 Malignant neoplasm of prostate: Secondary | ICD-10-CM | POA: Diagnosis not present

## 2013-06-25 DIAGNOSIS — C61 Malignant neoplasm of prostate: Secondary | ICD-10-CM | POA: Diagnosis not present

## 2013-06-25 DIAGNOSIS — N529 Male erectile dysfunction, unspecified: Secondary | ICD-10-CM | POA: Diagnosis not present

## 2013-07-08 DIAGNOSIS — E782 Mixed hyperlipidemia: Secondary | ICD-10-CM | POA: Diagnosis not present

## 2013-07-08 DIAGNOSIS — K219 Gastro-esophageal reflux disease without esophagitis: Secondary | ICD-10-CM | POA: Diagnosis not present

## 2013-07-08 DIAGNOSIS — Z1331 Encounter for screening for depression: Secondary | ICD-10-CM | POA: Diagnosis not present

## 2013-07-08 DIAGNOSIS — Z0000 Encounter for general adult medical examination without abnormal findings: Secondary | ICD-10-CM | POA: Diagnosis not present

## 2013-07-08 DIAGNOSIS — N529 Male erectile dysfunction, unspecified: Secondary | ICD-10-CM | POA: Diagnosis not present

## 2013-11-20 DIAGNOSIS — N529 Male erectile dysfunction, unspecified: Secondary | ICD-10-CM | POA: Diagnosis not present

## 2013-11-24 ENCOUNTER — Encounter (HOSPITAL_COMMUNITY): Payer: Self-pay | Admitting: Emergency Medicine

## 2013-11-24 ENCOUNTER — Emergency Department (HOSPITAL_COMMUNITY)
Admission: EM | Admit: 2013-11-24 | Discharge: 2013-11-24 | Disposition: A | Discharge status: Home / Self Care | Payer: Medicare Other | Attending: Emergency Medicine | Admitting: Emergency Medicine

## 2013-11-24 DIAGNOSIS — H9190 Unspecified hearing loss, unspecified ear: Secondary | ICD-10-CM | POA: Insufficient documentation

## 2013-11-24 DIAGNOSIS — S0993XA Unspecified injury of face, initial encounter: Secondary | ICD-10-CM | POA: Diagnosis not present

## 2013-11-24 DIAGNOSIS — G8929 Other chronic pain: Secondary | ICD-10-CM | POA: Insufficient documentation

## 2013-11-24 DIAGNOSIS — Z8739 Personal history of other diseases of the musculoskeletal system and connective tissue: Secondary | ICD-10-CM | POA: Insufficient documentation

## 2013-11-24 DIAGNOSIS — X500XXA Overexertion from strenuous movement or load, initial encounter: Secondary | ICD-10-CM | POA: Insufficient documentation

## 2013-11-24 DIAGNOSIS — M542 Cervicalgia: Secondary | ICD-10-CM | POA: Diagnosis not present

## 2013-11-24 DIAGNOSIS — Z8546 Personal history of malignant neoplasm of prostate: Secondary | ICD-10-CM | POA: Diagnosis not present

## 2013-11-24 DIAGNOSIS — Y9389 Activity, other specified: Secondary | ICD-10-CM | POA: Insufficient documentation

## 2013-11-24 DIAGNOSIS — Y9289 Other specified places as the place of occurrence of the external cause: Secondary | ICD-10-CM | POA: Insufficient documentation

## 2013-11-24 DIAGNOSIS — Z79899 Other long term (current) drug therapy: Secondary | ICD-10-CM | POA: Diagnosis not present

## 2013-11-24 MED ORDER — CYCLOBENZAPRINE HCL 5 MG PO TABS: 5.0000 mg | ORAL_TABLET | Freq: Three times a day (TID) | ORAL | Status: DC | PRN

## 2013-11-24 NOTE — ED Notes (Signed)
Pt states that he was at the gym this morning and was getting on the treadmill, turned his head to the rt, and is now having neck pain.  States that he cannot turn his head to the right.

## 2013-11-24 NOTE — ED Provider Notes (Signed)
CSN: [id number]     Arrival date & time 11/24/13  1022 History  This chart was scribed for Coral Ceo, PA working with Ward Givens, MD by Quintella Reichert, ED Scribe. This patient was seen in room WTR5/WTR5 and the patient's care was started at 12:10 PM.   Chief Complaint  Patient presents with  . Neck Pain    The history is provided by the patient. No language interpreter was used.    CAIDYN HENRICKSEN is a 57 y.o. male with a PMH of prostate cancer, arthritis, chronic back pain, hearing loss, and headaches who presents to the ED for evaluation of neck pain.  History was provided by the patient.  Pt states that this morning at around 9:45 AM he was getting on a treadmill and turned his head suddenly to the right and heard a pop.  He developed sudden onset of pain to the right side of his neck.  Pain is described as grabbing and pulling and is worsened by turning his head to the right.  He applied ice but has not attempted any other treatments pta.  Pt denies prior h/o similar pain.  He denies recent injuries that may have have caused pain.  He denies any other recent illness and has no other complaints at this time.  Pt states he has used Flexeril and Naprosyn for other issues in the past and they have both provided relief.  He admits to h/o multiple orthopedic surgeries.   Past Medical History  Diagnosis Date  . Insomnia   . Prostate cancer     2010  . Arthritis   . Back pain, chronic   . HOH (hard of hearing)   . JYNWGNFA(213.0)     Past Surgical History  Procedure Laterality Date  . Foot surgery      fx rt 2nd toe  . Knee arthroscopy      right and left knee scoped  . Elbow surgery      Right and Left Elbow  . Mandible fracture surgery      fx lt cheek  . Hernia repair      Esophageal Stretching  . Insertion prostate radiation seed    . Ganglion cyst excision  06/18/2012    Procedure: REMOVAL GANGLION OF WRIST;  Surgeon: Harvie Junior, MD;  Location: [facility];  Service: Orthopedics;  Laterality: Right;    No family history on file.   History  Substance Use Topics  . Smoking status: Never Smoker   . Smokeless tobacco: Not on file  . Alcohol Use: No     Review of Systems  Constitutional: Negative for fever and chills.  HENT: Negative for congestion, rhinorrhea and sore throat.   Respiratory: Negative for cough and shortness of breath.   Cardiovascular: Negative for chest pain.  Gastrointestinal: Negative for nausea, vomiting, abdominal pain and diarrhea.  Musculoskeletal: Positive for neck pain and neck stiffness. Negative for back pain, gait problem and myalgias.  Skin: Negative for color change and wound.  Neurological: Negative for dizziness, syncope, weakness, light-headedness, numbness and headaches.  All other systems reviewed and are negative.   Allergies  Review of patient's allergies indicates no known allergies.  Home Medications   Current Outpatient Rx  Name  Route  Sig  Dispense  Refill  . Ascorbic Acid (VITAMIN C) 1000 MG tablet   Oral   Take 1,000 mg by mouth daily.         . Cyanocobalamin (VITAMIN  B-12) 2500 MCG SUBL   Sublingual   Place 1 tablet under the tongue daily.           . Garlic 1000 MG CAPS   Oral   Take 2 capsules by mouth daily.           . Omega-3 Fatty Acids (FISH OIL) 1000 MG CAPS   Oral   Take 2 capsules by mouth daily.           Marland Kitchen OVER THE COUNTER MEDICATION   Oral   Take 1 packet by mouth daily. Multivitamin Packet          . vitamin E 1000 UNIT capsule   Oral   Take 1,000 Units by mouth daily.          BP 142/91  Pulse 70  Temp(Src) 99.1 F (37.3 C) (Oral)  Resp 22  SpO2 96%  Filed Vitals:   11/24/13 1130  BP: 142/91  Pulse: 70  Temp: 99.1 F (37.3 C)  TempSrc: Oral  Resp: 22  SpO2: 96%    Physical Exam  Nursing note and vitals reviewed. Constitutional: He is oriented to person, place, and time. He appears well-developed and well-nourished.  No distress.  HENT:  Head: Normocephalic and atraumatic.  Right Ear: External ear normal.  Left Ear: External ear normal.  Nose: Nose normal.  Mouth/Throat: Oropharynx is clear and moist. No oropharyngeal exudate.  Tympanic membranes gray and translucent bilaterally with no erythema, edema, or hemotympanum.    Eyes: Conjunctivae are normal. Pupils are equal, round, and reactive to light. Right eye exhibits no discharge. Left eye exhibits no discharge.  Neck: Normal range of motion. Neck supple.    No cervical spinal tenderness to palpation.  No limitations with neck ROM.  Tenderness to palpation to the right cervical paraspinal muscles.  Pain increased with lateral rotation of the neck.  No ecchymosis, edema, erythema or wounds to the neck throughout.  No LAD.    Cardiovascular: Normal rate, regular rhythm, normal heart sounds and intact distal pulses.  Exam reveals no gallop and no friction rub.   No murmur heard. Radial pulses present and equal bilaterally  Pulmonary/Chest: Effort normal and breath sounds normal. No respiratory distress. He has no wheezes. He has no rales. He exhibits no tenderness.  Abdominal: Soft. He exhibits no distension. There is no tenderness.  Musculoskeletal: Normal range of motion. He exhibits no edema and no tenderness.  No tenderness to palpation to the UE throughout.  Neck pain not increased with shoulder ROM.  Strength 5/5 in the upper extremities bilaterally.  No tenderness to palpation to the thoracic or lumbar spinous processes throughout.  No tenderness to palpation to the thoracic or lumbar paraspinal muscles throughout.  Patient able to ambulate without difficulty or ataxia.    Neurological: He is alert and oriented to person, place, and time.  Sensation intact in the UE bilaterally  Skin: Skin is warm and dry. He is not diaphoretic.    ED Course  Procedures (including critical care time)  DIAGNOSTIC STUDIES: Oxygen Saturation is 96% on room air,  normal by my interpretation.    COORDINATION OF CARE: 12:15 PM-Informed pt that symptoms are likely due to a muscle strain.  Discussed treatment plan which includes muscle relaxants, anti-inflammatories, warm/cold compresses, massage, rest and gentle stretching.  Advised return precautions.  Pt expressed understanding and agreed with plan.   Labs Review Labs Reviewed - No data to display  Imaging Review No results found.  EKG Interpretation   None       MDM   ANIK WESCH is a 57 y.o. male with a PMH of prostate cancer, arthritis, chronic back pain, hearing loss, and headaches who presents to the ED for evaluation of neck pain.     Neck pain likely due to a muscle strain.  No trauma/injuries.  X-rays not indicated at this time.  No cervical spinal tenderness.  Patient neurovascularly intact.  Patient prescribed flexeril which has worked for his back pain in the past.  Return precautions, discharge instructions, and follow-up was discussed with the patient before discharge.  Patient in agreement with discharge and plan.     Discharge Medication List as of 11/24/2013 12:23 PM    START taking these medications   Details  cyclobenzaprine (FLEXERIL) 5 MG tablet Take 1 tablet (5 mg total) by mouth 3 (three) times daily as needed for muscle spasms., Starting 11/24/2013, Until Discontinued, Print        Final impressions: 1. Neck pain on right side       Thomasenia Sales          Jillyn Ledger, [place] 11/27/13 [phone]

## 2013-12-04 NOTE — ED Provider Notes (Signed)
Medical screening examination/treatment/procedure(s) were performed by non-physician practitioner and as supervising physician I was immediately available for consultation/collaboration.  EKG Interpretation   None      Rolland Porter, MD, Abram Sander   Janice Norrie, MD 12/04/13 [phone]

## 2013-12-25 DIAGNOSIS — C61 Malignant neoplasm of prostate: Secondary | ICD-10-CM | POA: Diagnosis not present

## 2014-01-01 DIAGNOSIS — C61 Malignant neoplasm of prostate: Secondary | ICD-10-CM | POA: Diagnosis not present

## 2014-01-01 DIAGNOSIS — N529 Male erectile dysfunction, unspecified: Secondary | ICD-10-CM | POA: Diagnosis not present

## 2014-06-07 DIAGNOSIS — C61 Malignant neoplasm of prostate: Secondary | ICD-10-CM | POA: Diagnosis not present

## 2014-06-11 DIAGNOSIS — C61 Malignant neoplasm of prostate: Secondary | ICD-10-CM | POA: Diagnosis not present

## 2014-06-11 DIAGNOSIS — N529 Male erectile dysfunction, unspecified: Secondary | ICD-10-CM | POA: Diagnosis not present

## 2014-06-23 ENCOUNTER — Emergency Department (INDEPENDENT_AMBULATORY_CARE_PROVIDER_SITE_OTHER)
Admission: EM | Admit: 2014-06-23 | Discharge: 2014-06-23 | Disposition: A | Discharge status: Home / Self Care | Payer: Medicare Other | Source: Home / Self Care | Attending: Family Medicine | Admitting: Family Medicine

## 2014-06-23 ENCOUNTER — Emergency Department (INDEPENDENT_AMBULATORY_CARE_PROVIDER_SITE_OTHER): Payer: Medicare Other

## 2014-06-23 ENCOUNTER — Encounter (HOSPITAL_COMMUNITY): Payer: Self-pay | Admitting: Emergency Medicine

## 2014-06-23 DIAGNOSIS — IMO0002 Reserved for concepts with insufficient information to code with codable children: Secondary | ICD-10-CM | POA: Diagnosis not present

## 2014-06-23 DIAGNOSIS — X58XXXA Exposure to other specified factors, initial encounter: Secondary | ICD-10-CM | POA: Diagnosis not present

## 2014-06-23 DIAGNOSIS — S76112A Strain of left quadriceps muscle, fascia and tendon, initial encounter: Secondary | ICD-10-CM

## 2014-06-23 MED ORDER — HYDROCODONE-ACETAMINOPHEN 5-325 MG PO TABS
1.0000 | ORAL_TABLET | Freq: Once | ORAL | Status: AC
  Administered 2014-06-23: 1 via ORAL

## 2014-06-23 MED ORDER — HYDROCODONE-ACETAMINOPHEN 5-325 MG PO TABS: 1.0000 | ORAL_TABLET | Freq: Four times a day (QID) | ORAL | Status: DC | PRN

## 2014-06-23 MED ORDER — HYDROCODONE-ACETAMINOPHEN 5-325 MG PO TABS
ORAL_TABLET | ORAL | Status: AC
  Filled 2014-06-23: qty 1
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
## 2014-06-23 NOTE — Discharge Instructions (Signed)
Please consider purchasing a thigh compression sleeve from [email or web address]. You would be a size medium. Ice and elevation to reduce swelling. Ibuprofen for mild to moderate pain Vicodin as prescribed for severe pain. Please follow up with either Dr. Berenice Primas or Dr. Rip Harbour.  Quadriceps Strain with Rehab A strain is a tear in a muscle or the tendon that attaches the muscle to bone. A quadriceps strain is a tear in the muscles on the front of the thigh (quadriceps muscles) or their tendons. The quadriceps muscles are important for straightening the knee and bending the hip. The condition is characterized by pain, inflammation, and reduced function of these muscles. Strains are classified into three categories. Grade 1 strains cause pain, but the tendon is not lengthened. Grade 2 strains include a lengthened ligament due to the ligament being stretched or partially ruptured. With grade 2 strains there is still function, although the function may be diminished. Grade 3 strains are characterized by a complete tear of the tendon or muscle, and function is usually impaired.  SYMPTOMS   Pain, tenderness, inflammation, and/or bruising (contusion) over the quadriceps muscles  Pain that worsens with use of the quadriceps muscles.  Muscle spasm in the thigh.  Difficulty with common tasks that involve the quadriceps muscle, such as walking.  A crackling sound (crepitation) when the tendon is moved or touched.  Loss of fullness of the muscle or bulging within the area of muscle with complete rupture. CAUSES  A strain occurs when a force is placed on the muscle or tendon that is greater than it can withstand. Common mechanisms of injury include:  Repetitive strenuous use of the quadriceps muscles. This may be due to an increase in the intensity, frequency, or duration of exercise.  Direct trauma to the quadriceps muscles or tendons. RISK INCREASES WITH:  Activities that involve forceful contractions of the  quadriceps muscles (jumping or sprinting).  Contact sports (soccer or football).  Poor strength and flexibility.  Failure to warm-up properly before activity.  Previous injury to the thigh or knee. PREVENTION  Warm up and stretch properly before activity.  Allow for adequate recovery between workouts.  Maintain physical fitness:  Strength, flexibility, and endurance.  Cardiovascular fitness.  Wear properly fitted and padded protective equipment. PROGNOSIS  If treated properly, then quadriceps muscles strains are usually curable within 6 weeks.  RELATED COMPLICATIONS   Prolonged healing time, if improperly treated or re-injured.  Recurrent symptoms that result in a chronic problem.  Recurrence of symptoms if activity is resumed too soon. TREATMENT  Treatment initially involves the use of ice and medication to help reduce pain and inflammation. The use of strengthening and stretching exercises may help reduce pain with activity. These exercises may be performed at home or with referral to a therapist. Crutches may be recommended to allow the muscle to rest until walking can be completed without limping. Surgery is rarely necessary for this injury, but may be considered if the injury involves a grade 3 strain, or if symptoms persist for greater than 3 months despite non-surgical (conservative) treatment.  MEDICATION  If pain medication is necessary, then nonsteroidal anti-inflammatory medications, such as aspirin and ibuprofen, or other minor pain relievers, such as acetaminophen, are often recommended.  Do not take pain medication for 7 days before surgery.  Prescription pain relievers may be given if deemed necessary by your caregiver. Use only as directed and only as much as you need.  Ointments applied to the skin may be helpful.  Corticosteroid  injections may be given by your caregiver. These injections should be reserved for the most serious cases, because they may only  be given a certain number of times. HEAT AND COLD  Cold treatment (icing) relieves pain and reduces inflammation. Cold treatment should be applied for 10 to 15 minutes every 2 to 3 hours for inflammation and pain and immediately after any activity that aggravates your symptoms. Use ice packs or massage the area with a piece of ice (ice massage).  Heat treatment may be used prior to performing the stretching and strengthening activities prescribed by your caregiver, physical therapist, or athletic trainer. Use a heat pack or soak the injury in warm water. SEEK MEDICAL CARE IF:  Treatment seems to offer no benefit, or the condition worsens.  Any medications produce adverse side effects. EXERCISES  RANGE OF MOTION (ROM) AND STRETCHING EXERCISES - Quadriceps Strain These exercises may help you when beginning to rehabilitate your injury. Your symptoms may resolve with or without further involvement from your physician, physical therapist or athletic trainer. While completing these exercises, remember:   Restoring tissue flexibility helps normal motion to return to the joints. This allows healthier, less painful movement and activity.  An effective stretch should be held for at least 30 seconds.  A stretch should never be painful. You should only feel a gentle lengthening or release in the stretched tissue. RANGE OF MOTION - Knee Flexion, Active  Lie on your back with both knees straight. (If this causes back discomfort, bend your opposite knee, placing your foot flat on the floor.)  Slowly slide your heel back toward your buttocks until you feel a gentle stretch in the front of your knee or thigh.  Hold for __________ seconds. Slowly slide your heel back to the starting position. Repeat __________ times. Complete this exercise __________ times per day.  STRETCH - Quadriceps, Prone  Lie on your stomach on a firm surface, such as a bed or padded floor.  Bend your right / left knee and grasp  your ankle. If you are unable to reach, your ankle or pant leg, use a belt around your foot to lengthen your reach.  Gently pull your heel toward your buttocks. Your knee should not slide out to the side. You should feel a stretch in the front of your thigh and/or knee.  Hold this position for __________ seconds. Repeat __________ times. Complete this stretch __________ times per day.  STRETCHING - Hip Flexors, Lunge  Half kneel with your right / left knee on the floor and your opposite knee bent and directly over your ankle.  Keep good posture with your head over your shoulders. Tighten your buttocks to point your tailbone downward; this will prevent your back from arching too much.  You should feel a gentle stretch in the front of your thigh and/or hip. If you do not feel any resistance, slightly slide your opposite foot forward and then slowly lunge forward so your knee once again lines up over your ankle. Be sure your tailbone remains pointed downward.  Hold this stretch for __________ seconds. Repeat __________ times. Complete this stretch __________ times per day. STRENGTHENING EXERCISES - Quadriceps Strain These exercises may help you when beginning to rehabilitate your injury. They may resolve your symptoms with or without further involvement from your physician, physical therapist or athletic trainer. While completing these exercises, remember:   Muscles can gain both the endurance and the strength needed for everyday activities through controlled exercises.  Complete these exercises  as instructed by your physician, physical therapist or athletic trainer. Progress the resistance and repetitions only as guided. STRENGTH - Quadriceps, Isometrics  Lie on your back with your right / left leg extended and your opposite knee bent.  Gradually tense the muscles in the front of your right / left thigh. You should see either your knee cap slide up toward your hip or increased dimpling just  above the knee. This motion will push the back of the knee down toward the floor/mat/bed on which you are lying.  Hold the muscle as tight as you can without increasing your pain for __________ seconds.  Relax the muscles slowly and completely in between each repetition. Repeat __________ times. Complete this exercise __________ times per day.  STRENGTH - Quadriceps, Short Arcs   Lie on your back. Place a __________ inch towel roll under your knee so that the knee slightly bends.  Raise only your lower leg by tightening the muscles in the front of your thigh. Do not allow your thigh to rise.  Hold this position for __________ seconds. Repeat __________ times. Complete this exercise __________ times per day.  OPTIONAL ANKLE WEIGHTS: Begin with ____________________, but DO NOT exceed ____________________. Increase in1 lb/0.5 kg increments. STRENGTH - Quadriceps, Straight Leg Raises  Quality counts! Watch for signs that the quadriceps muscle is working to insure you are strengthening the correct muscles and not "cheating" by substituting with healthier muscles.  Lay on your back with your right / left leg extended and your opposite knee bent.  Tense the muscles in the front of your right / left thigh. You should see either your knee cap slide up or increased dimpling just above the knee. Your thigh may even quiver.  Tighten these muscles even more and raise your leg 4 to 6 inches off the floor. Hold for __________ seconds.  Keeping these muscles tense, lower your leg.  Relax the muscles slowly and completely in between each repetition. Repeat __________ times. Complete this exercise __________ times per day.  STRENGTH - Quadriceps, Wall Slides  Follow guidelines for form closely. Increased knee pain often results from poorly placed feet or knees.  Lean against a smooth wall or door and walk your feet out 18-24 inches. Place your feet hip-width apart.  Slowly slide down the wall or door  until your knees bend __________ degrees.* Keep your knees over your heels, not your toes, and in line with your hips, not falling to either side.  Hold for __________ seconds. Stand up to rest for __________ seconds in between each repetition. Repeat __________ times. Complete this exercise __________ times per day. * Your physician, physical therapist or athletic trainer will alter this angle based on your symptoms and progress. STRENGTH - Quadriceps, Step-Ups   Use a thick book, step or step stool that is __________ inches tall.  Holding a wall or counter for balance only, not support.  Slowly step-up with your right / left foot, keeping your knee in line with your hip and foot. Do not allow your knee to bend so far that you cannot see your toes.  Slowly unlock your knee and lower yourself to the starting position. Your muscles, not gravity, should lower you. Repeat __________ times. Complete this exercise __________ times per day. Document Released: 11/19/2005 Document Revised: 02/11/2012 Document Reviewed: 03/03/2009 [facility] Patient Information 2015 [place], [place]. This information is not intended to replace advice given to you by your health care provider. Make sure you discuss any questions you  have with your health care provider. ° °
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
## 2014-06-25 NOTE — ED Provider Notes (Signed)
.  Medical screening examination/treatment/procedure(s) were performed by a resident physician or non-physician practitioner and as the supervising physician I was immediately available for consultation/collaboration.  Lynne Leader, MD    Gregor Hams, MD 06/25/14 [phone]

## 2014-06-25 NOTE — ED Provider Notes (Signed)
Limited musculoskeletal ultrasound of the left thigh  Quadriceps tendon is intact without tear visualized with both longitudinal and transverse views. The distal muscle belly of the rectus femoris appears to have a defect with significant heterogeneous appearance superficial to the muscle fibers consistent with hematoma.  Impression: Quadriceps partial thickness tear with resulting hematoma.  Gregor Hams, MD 06/25/14 [phone]

## 2014-07-05 DIAGNOSIS — M79609 Pain in unspecified limb: Secondary | ICD-10-CM | POA: Diagnosis not present

## 2014-07-22 DIAGNOSIS — Z1331 Encounter for screening for depression: Secondary | ICD-10-CM | POA: Diagnosis not present

## 2014-07-22 DIAGNOSIS — Z0000 Encounter for general adult medical examination without abnormal findings: Secondary | ICD-10-CM | POA: Diagnosis not present

## 2014-07-22 DIAGNOSIS — E785 Hyperlipidemia, unspecified: Secondary | ICD-10-CM | POA: Diagnosis not present

## 2014-08-02 ENCOUNTER — Other Ambulatory Visit: Payer: Self-pay | Admitting: Internal Medicine

## 2014-08-02 DIAGNOSIS — E785 Hyperlipidemia, unspecified: Secondary | ICD-10-CM

## 2014-08-10 ENCOUNTER — Other Ambulatory Visit: Payer: Self-pay | Admitting: Internal Medicine

## 2014-08-10 DIAGNOSIS — E785 Hyperlipidemia, unspecified: Secondary | ICD-10-CM

## 2014-08-17 ENCOUNTER — Ambulatory Visit
Admission: RE | Admit: 2014-08-17 | Discharge: 2014-08-17 | Disposition: A | Discharge status: Home / Self Care | Payer: PRIVATE HEALTH INSURANCE | Source: Ambulatory Visit | Attending: Internal Medicine | Admitting: Internal Medicine

## 2014-08-17 ENCOUNTER — Encounter (INDEPENDENT_AMBULATORY_CARE_PROVIDER_SITE_OTHER): Payer: Self-pay

## 2014-08-17 DIAGNOSIS — E785 Hyperlipidemia, unspecified: Secondary | ICD-10-CM

## 2014-08-25 DIAGNOSIS — Z79899 Other long term (current) drug therapy: Secondary | ICD-10-CM | POA: Diagnosis not present

## 2014-08-25 DIAGNOSIS — E782 Mixed hyperlipidemia: Secondary | ICD-10-CM | POA: Diagnosis not present

## 2014-09-30 DIAGNOSIS — E782 Mixed hyperlipidemia: Secondary | ICD-10-CM | POA: Diagnosis not present

## 2014-09-30 DIAGNOSIS — Z79899 Other long term (current) drug therapy: Secondary | ICD-10-CM | POA: Diagnosis not present

## 2014-11-11 DIAGNOSIS — E782 Mixed hyperlipidemia: Secondary | ICD-10-CM | POA: Diagnosis not present

## 2014-12-10 DIAGNOSIS — C61 Malignant neoplasm of prostate: Secondary | ICD-10-CM | POA: Diagnosis not present

## 2014-12-17 DIAGNOSIS — N3281 Overactive bladder: Secondary | ICD-10-CM | POA: Diagnosis not present

## 2014-12-17 DIAGNOSIS — Z8546 Personal history of malignant neoplasm of prostate: Secondary | ICD-10-CM | POA: Diagnosis not present

## 2014-12-17 DIAGNOSIS — N5201 Erectile dysfunction due to arterial insufficiency: Secondary | ICD-10-CM | POA: Diagnosis not present

## 2015-01-21 DIAGNOSIS — N5201 Erectile dysfunction due to arterial insufficiency: Secondary | ICD-10-CM | POA: Diagnosis not present

## 2015-06-24 DIAGNOSIS — Z8546 Personal history of malignant neoplasm of prostate: Secondary | ICD-10-CM | POA: Diagnosis not present

## 2015-07-28 ENCOUNTER — Other Ambulatory Visit: Payer: Self-pay | Admitting: Internal Medicine

## 2015-07-28 DIAGNOSIS — Z1389 Encounter for screening for other disorder: Secondary | ICD-10-CM | POA: Diagnosis not present

## 2015-07-28 DIAGNOSIS — R51 Headache: Principal | ICD-10-CM

## 2015-07-28 DIAGNOSIS — R519 Headache, unspecified: Secondary | ICD-10-CM

## 2015-07-28 DIAGNOSIS — E782 Mixed hyperlipidemia: Secondary | ICD-10-CM | POA: Diagnosis not present

## 2015-07-28 DIAGNOSIS — Z0000 Encounter for general adult medical examination without abnormal findings: Secondary | ICD-10-CM | POA: Diagnosis not present

## 2015-08-03 ENCOUNTER — Ambulatory Visit
Admission: RE | Admit: 2015-08-03 | Discharge: 2015-08-03 | Disposition: A | Discharge status: Home / Self Care | Payer: Medicare Other | Source: Ambulatory Visit | Attending: Internal Medicine | Admitting: Internal Medicine

## 2015-08-03 DIAGNOSIS — R519 Headache, unspecified: Secondary | ICD-10-CM

## 2015-08-03 DIAGNOSIS — R51 Headache: Secondary | ICD-10-CM | POA: Diagnosis not present

## 2015-09-15 DIAGNOSIS — M7711 Lateral epicondylitis, right elbow: Secondary | ICD-10-CM | POA: Diagnosis not present

## 2015-09-15 DIAGNOSIS — M25532 Pain in left wrist: Secondary | ICD-10-CM | POA: Diagnosis not present

## 2015-09-15 DIAGNOSIS — M1812 Unilateral primary osteoarthritis of first carpometacarpal joint, left hand: Secondary | ICD-10-CM | POA: Diagnosis not present

## 2015-09-27 DIAGNOSIS — Z8546 Personal history of malignant neoplasm of prostate: Secondary | ICD-10-CM | POA: Diagnosis not present

## 2015-10-25 DIAGNOSIS — E782 Mixed hyperlipidemia: Secondary | ICD-10-CM | POA: Diagnosis not present

## 2016-05-17 DIAGNOSIS — M7711 Lateral epicondylitis, right elbow: Secondary | ICD-10-CM | POA: Diagnosis not present

## 2016-05-28 DIAGNOSIS — M7711 Lateral epicondylitis, right elbow: Secondary | ICD-10-CM | POA: Diagnosis not present

## 2016-05-28 DIAGNOSIS — M25521 Pain in right elbow: Secondary | ICD-10-CM | POA: Diagnosis not present

## 2016-06-08 DIAGNOSIS — Z8546 Personal history of malignant neoplasm of prostate: Secondary | ICD-10-CM | POA: Diagnosis not present

## 2016-07-09 DIAGNOSIS — Z8546 Personal history of malignant neoplasm of prostate: Secondary | ICD-10-CM | POA: Diagnosis not present

## 2016-07-09 DIAGNOSIS — N5201 Erectile dysfunction due to arterial insufficiency: Secondary | ICD-10-CM | POA: Diagnosis not present

## 2016-07-27 DIAGNOSIS — E782 Mixed hyperlipidemia: Secondary | ICD-10-CM | POA: Diagnosis not present

## 2016-07-27 DIAGNOSIS — Z0000 Encounter for general adult medical examination without abnormal findings: Secondary | ICD-10-CM | POA: Diagnosis not present

## 2016-07-27 DIAGNOSIS — Z1389 Encounter for screening for other disorder: Secondary | ICD-10-CM | POA: Diagnosis not present

## 2017-07-01 DIAGNOSIS — S50819A Abrasion of unspecified forearm, initial encounter: Secondary | ICD-10-CM | POA: Diagnosis not present

## 2017-07-02 DIAGNOSIS — C61 Malignant neoplasm of prostate: Secondary | ICD-10-CM | POA: Diagnosis not present

## 2017-07-03 DIAGNOSIS — L03818 Cellulitis of other sites: Secondary | ICD-10-CM | POA: Diagnosis not present

## 2017-07-29 DIAGNOSIS — N5201 Erectile dysfunction due to arterial insufficiency: Secondary | ICD-10-CM | POA: Diagnosis not present

## 2017-07-29 DIAGNOSIS — Z8546 Personal history of malignant neoplasm of prostate: Secondary | ICD-10-CM | POA: Diagnosis not present

## 2017-07-30 DIAGNOSIS — S838X1A Sprain of other specified parts of right knee, initial encounter: Secondary | ICD-10-CM | POA: Diagnosis not present

## 2017-08-09 DIAGNOSIS — E782 Mixed hyperlipidemia: Secondary | ICD-10-CM | POA: Diagnosis not present

## 2017-08-09 DIAGNOSIS — Z8546 Personal history of malignant neoplasm of prostate: Secondary | ICD-10-CM | POA: Diagnosis not present

## 2017-08-09 DIAGNOSIS — Z0000 Encounter for general adult medical examination without abnormal findings: Secondary | ICD-10-CM | POA: Diagnosis not present

## 2017-08-09 DIAGNOSIS — Z1389 Encounter for screening for other disorder: Secondary | ICD-10-CM | POA: Diagnosis not present

## 2017-08-09 DIAGNOSIS — M4316 Spondylolisthesis, lumbar region: Secondary | ICD-10-CM | POA: Diagnosis not present

## 2017-09-09 DIAGNOSIS — H524 Presbyopia: Secondary | ICD-10-CM | POA: Diagnosis not present

## 2017-09-09 DIAGNOSIS — H26052 Posterior subcapsular polar infantile and juvenile cataract, left eye: Secondary | ICD-10-CM | POA: Diagnosis not present

## 2017-09-09 DIAGNOSIS — H52223 Regular astigmatism, bilateral: Secondary | ICD-10-CM | POA: Diagnosis not present

## 2017-09-09 DIAGNOSIS — H5203 Hypermetropia, bilateral: Secondary | ICD-10-CM | POA: Diagnosis not present

## 2017-10-07 DIAGNOSIS — E782 Mixed hyperlipidemia: Secondary | ICD-10-CM | POA: Diagnosis not present

## 2018-01-23 DIAGNOSIS — Z5181 Encounter for therapeutic drug level monitoring: Secondary | ICD-10-CM | POA: Diagnosis not present

## 2018-01-23 DIAGNOSIS — E782 Mixed hyperlipidemia: Secondary | ICD-10-CM | POA: Diagnosis not present

## 2018-07-24 DIAGNOSIS — Z8546 Personal history of malignant neoplasm of prostate: Secondary | ICD-10-CM | POA: Diagnosis not present

## 2018-07-31 DIAGNOSIS — Z8546 Personal history of malignant neoplasm of prostate: Secondary | ICD-10-CM | POA: Diagnosis not present

## 2018-08-19 DIAGNOSIS — N529 Male erectile dysfunction, unspecified: Secondary | ICD-10-CM | POA: Diagnosis not present

## 2018-08-19 DIAGNOSIS — Z1389 Encounter for screening for other disorder: Secondary | ICD-10-CM | POA: Diagnosis not present

## 2018-08-19 DIAGNOSIS — Z8546 Personal history of malignant neoplasm of prostate: Secondary | ICD-10-CM | POA: Diagnosis not present

## 2018-08-19 DIAGNOSIS — E782 Mixed hyperlipidemia: Secondary | ICD-10-CM | POA: Diagnosis not present

## 2018-08-19 DIAGNOSIS — Z0000 Encounter for general adult medical examination without abnormal findings: Secondary | ICD-10-CM | POA: Diagnosis not present

## 2018-09-01 DIAGNOSIS — H9202 Otalgia, left ear: Secondary | ICD-10-CM | POA: Diagnosis not present

## 2018-10-08 DIAGNOSIS — N5201 Erectile dysfunction due to arterial insufficiency: Secondary | ICD-10-CM | POA: Diagnosis not present

## 2018-10-08 DIAGNOSIS — C61 Malignant neoplasm of prostate: Secondary | ICD-10-CM | POA: Diagnosis not present

## 2018-10-13 DIAGNOSIS — R609 Edema, unspecified: Secondary | ICD-10-CM | POA: Diagnosis not present

## 2018-10-13 DIAGNOSIS — M722 Plantar fascial fibromatosis: Secondary | ICD-10-CM | POA: Diagnosis not present

## 2018-10-13 DIAGNOSIS — M79671 Pain in right foot: Secondary | ICD-10-CM | POA: Diagnosis not present

## 2018-10-13 DIAGNOSIS — S20219A Contusion of unspecified front wall of thorax, initial encounter: Secondary | ICD-10-CM | POA: Diagnosis not present

## 2018-10-13 DIAGNOSIS — M84374A Stress fracture, right foot, initial encounter for fracture: Secondary | ICD-10-CM | POA: Diagnosis not present

## 2018-10-23 DIAGNOSIS — B0089 Other herpesviral infection: Secondary | ICD-10-CM | POA: Diagnosis not present

## 2018-10-28 DIAGNOSIS — B029 Zoster without complications: Secondary | ICD-10-CM | POA: Diagnosis not present

## 2018-11-19 ENCOUNTER — Emergency Department (HOSPITAL_COMMUNITY)
Admission: EM | Admit: 2018-11-19 | Discharge: 2018-11-19 | Disposition: A | Discharge status: Home / Self Care | Payer: Medicare HMO | Attending: Emergency Medicine | Admitting: Emergency Medicine

## 2018-11-19 ENCOUNTER — Other Ambulatory Visit: Payer: Self-pay

## 2018-11-19 ENCOUNTER — Encounter (HOSPITAL_COMMUNITY): Payer: Self-pay | Admitting: Emergency Medicine

## 2018-11-19 DIAGNOSIS — S0993XA Unspecified injury of face, initial encounter: Secondary | ICD-10-CM | POA: Diagnosis present

## 2018-11-19 DIAGNOSIS — Y929 Unspecified place or not applicable: Secondary | ICD-10-CM | POA: Insufficient documentation

## 2018-11-19 DIAGNOSIS — Y9379 Activity, other specified sports and athletics: Secondary | ICD-10-CM | POA: Insufficient documentation

## 2018-11-19 DIAGNOSIS — Z79899 Other long term (current) drug therapy: Secondary | ICD-10-CM | POA: Diagnosis not present

## 2018-11-19 DIAGNOSIS — W2189XA Striking against or struck by other sports equipment, initial encounter: Secondary | ICD-10-CM | POA: Insufficient documentation

## 2018-11-19 DIAGNOSIS — Z8546 Personal history of malignant neoplasm of prostate: Secondary | ICD-10-CM | POA: Insufficient documentation

## 2018-11-19 DIAGNOSIS — S01511A Laceration without foreign body of lip, initial encounter: Secondary | ICD-10-CM | POA: Diagnosis not present

## 2018-11-19 DIAGNOSIS — Y998 Other external cause status: Secondary | ICD-10-CM | POA: Insufficient documentation

## 2018-11-19 DIAGNOSIS — S0181XA Laceration without foreign body of other part of head, initial encounter: Secondary | ICD-10-CM | POA: Diagnosis not present

## 2018-11-19 MED ORDER — LIDOCAINE-EPINEPHRINE (PF) 2 %-1:200000 IJ SOLN
5.0000 mL | Freq: Once | INTRAMUSCULAR | Status: AC
  Administered 2018-11-19: 5 mL via INTRADERMAL
  Filled 2018-11-19: qty 20

## 2018-11-19 NOTE — ED Triage Notes (Signed)
Pt was at home doing curing exercises and hit his lip with a 35lb weight. Pt wasn't sure if he needed stitches or not

## 2018-11-19 NOTE — ED Notes (Signed)
ED Provider at bedside. 

## 2018-11-19 NOTE — ED Notes (Signed)
Patient verbalizes understanding of discharge instructions. Opportunity for questioning and answers were provided. Armband removed by staff, pt discharged from ED ambulatory.   

## 2018-11-19 NOTE — ED Provider Notes (Signed)
[facility] EMERGENCY DEPARTMENT Provider Note   CSN: [id number] Arrival date & time: 11/19/18  1247     History   Chief Complaint Chief Complaint  Patient presents with  . Laceration    HPI CADEL STAIRS is a 62 y.o. male.  HPI   He injured his lip today while lifting weights accidentally hitting it.  No dental or jaw injuries.  No other complaints.  Last tetanus given 2 years ago.  There are no other known modifying factors.  Past Medical History:  Diagnosis Date  . Arthritis   . Back pain, chronic   . Headache(784.0)   . HOH (hard of hearing)   . Insomnia   . Prostate cancer ([place])    2010    There are no active problems to display for this patient.   Past Surgical History:  Procedure Laterality Date  . ELBOW SURGERY     Right and Left Elbow  . FOOT SURGERY     fx rt 2nd toe  . GANGLION CYST EXCISION  06/18/2012   Procedure: REMOVAL GANGLION OF WRIST;  Surgeon: Alta Corning, MD;  Location: [facility];  Service: Orthopedics;  Laterality: Right;  . HERNIA REPAIR     Esophageal Stretching  . INSERTION PROSTATE RADIATION SEED    . KNEE ARTHROSCOPY     right and left knee scoped  . MANDIBLE FRACTURE SURGERY     fx lt cheek        Home Medications    Prior to Admission medications   Medication Sig Start Date End Date Taking? Authorizing Provider  Ascorbic Acid (VITAMIN C) 1000 MG tablet Take 1,000 mg by mouth daily.   Yes [provider]  Cyanocobalamin (VITAMIN B-12) 2500 MCG SUBL Place 1 tablet under the tongue daily.     Yes [provider]  Garlic 1950 MG CAPS Take 2 capsules by mouth daily.     Yes [provider]  Omega-3 Fatty Acids (FISH OIL) 1000 MG CAPS Take 2 capsules by mouth daily.     Yes [provider]  OVER THE COUNTER MEDICATION Take 1 packet by mouth daily. Multivitamin Packet    Yes [provider]  OVER THE COUNTER MEDICATION Take 1 capsule by mouth  daily. [place] Supplement   Yes [provider]  vitamin E 1000 UNIT capsule Take 1,000 Units by mouth daily.   Yes [provider]    Family History No family history on file.  Social History Social History   Tobacco Use  . Smoking status: Never Smoker  Substance Use Topics  . Alcohol use: No  . Drug use: No     Allergies   Lovastatin   Review of Systems Review of Systems  All other systems reviewed and are negative.    Physical Exam Updated Vital Signs BP 128/76 (BP Location: Right Arm)   Pulse 87   Temp 98.4 F (36.9 C) (Oral)   Resp 18   Ht 6' (1.829 m)   Wt 89.8 kg   SpO2 96%   BMI 26.85 kg/m   Physical Exam Vitals signs and nursing note reviewed.  Constitutional:      Appearance: He is well-developed.  HENT:     Head: Normocephalic and atraumatic.     Right Ear: External ear normal.     Left Ear: External ear normal.     Nose: No congestion or rhinorrhea.     Mouth/Throat:  Comments: Lower lip with superficial laceration, gaping and bleeding some on the dry mucosa. Eyes:     Conjunctiva/sclera: Conjunctivae normal.     Pupils: Pupils are equal, round, and reactive to light.  Neck:     Musculoskeletal: Normal range of motion and neck supple.     Trachea: Phonation normal.  Cardiovascular:     Rate and Rhythm: Normal rate.  Pulmonary:     Effort: Pulmonary effort is normal.  Musculoskeletal: Normal range of motion.  Skin:    General: Skin is warm and dry.  Neurological:     Mental Status: He is alert and oriented to person, place, and time.     Cranial Nerves: No cranial nerve deficit.     Sensory: No sensory deficit.     Motor: No abnormal muscle tone.     Coordination: Coordination normal.  Psychiatric:        Behavior: Behavior normal.        Thought Content: Thought content normal.        Judgment: Judgment normal.      ED Treatments / Results  Labs (all labs ordered are listed, but only abnormal results are  displayed) Labs Reviewed - No data to display  EKG None  Radiology No results found.  Procedures .Marland KitchenLaceration Repair Date/Time: 11/19/2018 2:32 PM Performed by: Daleen Bo, MD Authorized by: Daleen Bo, MD   Consent:    Consent obtained:  Verbal   Consent given by:  Patient   Risks discussed:  Infection, pain and poor cosmetic result   Alternatives discussed:  No treatment Anesthesia (see MAR for exact dosages):    Anesthesia method:  Local infiltration   Local anesthetic:  Lidocaine 2% WITH epi Laceration details:    Location:  Lip   Lip location:  Lower exterior lip   Length (cm):  0.6   Depth (mm):  3 Repair type:    Repair type:  Simple Pre-procedure details:    Preparation:  Patient was prepped and draped in usual sterile fashion Exploration:    Hemostasis achieved with:  Direct pressure   Wound exploration: wound explored through full range of motion     Wound extent: no fascia violation noted and no foreign bodies/material noted     Contaminated: no   Treatment:    Area cleansed with:  Betadine   Amount of cleaning:  Standard   Irrigation solution:  Sterile saline   Irrigation volume:  10 ml Skin repair:    Repair method:  Sutures   Suture size:  4-0   Wound skin closure material used: Vicryl.   Suture technique:  Simple interrupted   Number of sutures:  2 Approximation:    Approximation:  Close   Vermilion border: poorly aligned   Post-procedure details:    Dressing:  Open (no dressing)   Patient tolerance of procedure:  Tolerated well, no immediate complications   (including critical care time)  Medications Ordered in ED Medications  lidocaine-EPINEPHrine (XYLOCAINE W/EPI) 2 %-1:200000 (PF) injection 5 mL (5 mLs Intradermal Given by Other 11/19/18 1346)     Initial Impression / Assessment and Plan / ED Course  I have reviewed the triage vital signs and the nursing notes.  Pertinent labs & imaging results that were available during my  care of the patient were reviewed by me and considered in my medical decision making (see chart for details).      Patient Vitals for the past 24 hrs:  BP Temp Temp src Pulse  Resp SpO2 Height Weight  11/19/18 1257 128/76 98.4 F (36.9 C) Oral 87 18 96 % 6' (1.829 m) 89.8 kg    At discharge- reevaluation with update and discussion. After initial assessment and treatment, an updated evaluation reveals no change in clinical status, findings discussed and questions answered. Daleen Bo   Medical Decision Making: Accidental injury lower lip with laceration.  Laceration closed with suture material.  CRITICAL CARE-no Performed by: Daleen Bo  Nursing Notes Reviewed/ Care Coordinated Applicable Imaging Reviewed Interpretation of Laboratory Data incorporated into ED treatment  The patient appears reasonably screened and/or stabilized for discharge and I doubt any other medical condition or other [facility] requiring further screening, evaluation, or treatment in the ED at this time prior to discharge.  Plan: Home Medications-OTC analgesia of choice; Home Treatments-wound care; return here if the recommended treatment, does not improve the symptoms; Recommended follow up-removal for 5 days.   Final Clinical Impressions(s) / ED Diagnoses   Final diagnoses:  Facial laceration, initial encounter    ED Discharge Orders    None       Daleen Bo, MD 11/19/18 1437

## 2018-11-19 NOTE — Discharge Instructions (Addendum)
Keep the wound on your lip clean with soap and water.  Apply bacitracin or Neosporin daily.  Have the sutures removed in 4 or 5 days.

## 2018-11-24 DIAGNOSIS — S01511D Laceration without foreign body of lip, subsequent encounter: Secondary | ICD-10-CM | POA: Diagnosis not present

## 2019-07-21 DIAGNOSIS — R0789 Other chest pain: Secondary | ICD-10-CM | POA: Diagnosis not present

## 2019-07-21 DIAGNOSIS — M65311 Trigger thumb, right thumb: Secondary | ICD-10-CM | POA: Diagnosis not present

## 2019-07-21 DIAGNOSIS — N50811 Right testicular pain: Secondary | ICD-10-CM | POA: Diagnosis not present

## 2019-07-21 DIAGNOSIS — M65312 Trigger thumb, left thumb: Secondary | ICD-10-CM | POA: Diagnosis not present

## 2019-07-23 DIAGNOSIS — M1812 Unilateral primary osteoarthritis of first carpometacarpal joint, left hand: Secondary | ICD-10-CM | POA: Diagnosis not present

## 2019-07-23 DIAGNOSIS — M1811 Unilateral primary osteoarthritis of first carpometacarpal joint, right hand: Secondary | ICD-10-CM | POA: Diagnosis not present

## 2019-08-25 DIAGNOSIS — Z1211 Encounter for screening for malignant neoplasm of colon: Secondary | ICD-10-CM | POA: Diagnosis not present

## 2019-08-25 DIAGNOSIS — Z0000 Encounter for general adult medical examination without abnormal findings: Secondary | ICD-10-CM | POA: Diagnosis not present

## 2019-08-25 DIAGNOSIS — Z1389 Encounter for screening for other disorder: Secondary | ICD-10-CM | POA: Diagnosis not present

## 2019-08-25 DIAGNOSIS — Z5181 Encounter for therapeutic drug level monitoring: Secondary | ICD-10-CM | POA: Diagnosis not present

## 2019-08-25 DIAGNOSIS — E782 Mixed hyperlipidemia: Secondary | ICD-10-CM | POA: Diagnosis not present

## 2019-08-25 DIAGNOSIS — Z1159 Encounter for screening for other viral diseases: Secondary | ICD-10-CM | POA: Diagnosis not present

## 2019-09-04 DIAGNOSIS — E782 Mixed hyperlipidemia: Secondary | ICD-10-CM | POA: Diagnosis not present

## 2019-09-04 DIAGNOSIS — Z1159 Encounter for screening for other viral diseases: Secondary | ICD-10-CM | POA: Diagnosis not present

## 2019-09-04 DIAGNOSIS — Z5181 Encounter for therapeutic drug level monitoring: Secondary | ICD-10-CM | POA: Diagnosis not present

## 2019-10-26 DIAGNOSIS — E7849 Other hyperlipidemia: Secondary | ICD-10-CM | POA: Diagnosis not present

## 2020-01-06 DIAGNOSIS — R0789 Other chest pain: Secondary | ICD-10-CM | POA: Diagnosis not present

## 2020-02-01 DIAGNOSIS — M79644 Pain in right finger(s): Secondary | ICD-10-CM | POA: Diagnosis not present

## 2020-02-09 DIAGNOSIS — M1811 Unilateral primary osteoarthritis of first carpometacarpal joint, right hand: Secondary | ICD-10-CM | POA: Diagnosis not present

## 2020-02-09 DIAGNOSIS — S63601A Unspecified sprain of right thumb, initial encounter: Secondary | ICD-10-CM | POA: Diagnosis not present

## 2020-02-10 ENCOUNTER — Other Ambulatory Visit (HOSPITAL_BASED_OUTPATIENT_CLINIC_OR_DEPARTMENT_OTHER): Payer: Self-pay

## 2020-02-10 DIAGNOSIS — G4700 Insomnia, unspecified: Secondary | ICD-10-CM

## 2020-02-10 DIAGNOSIS — G4733 Obstructive sleep apnea (adult) (pediatric): Secondary | ICD-10-CM

## 2020-02-23 ENCOUNTER — Other Ambulatory Visit (HOSPITAL_COMMUNITY)
Admission: RE | Admit: 2020-02-23 | Discharge: 2020-02-23 | Disposition: A | Discharge status: Home / Self Care | Payer: Medicare HMO | Source: Ambulatory Visit | Attending: Internal Medicine | Admitting: Internal Medicine

## 2020-02-23 DIAGNOSIS — Z20822 Contact with and (suspected) exposure to covid-19: Secondary | ICD-10-CM | POA: Insufficient documentation

## 2020-02-23 DIAGNOSIS — Z01812 Encounter for preprocedural laboratory examination: Secondary | ICD-10-CM | POA: Insufficient documentation

## 2020-02-23 LAB — SARS CORONAVIRUS 2 (TAT 6-24 HRS): SARS Coronavirus 2: NEGATIVE

## 2020-02-25 ENCOUNTER — Ambulatory Visit (HOSPITAL_BASED_OUTPATIENT_CLINIC_OR_DEPARTMENT_OTHER): Payer: Medicare HMO | Admitting: Internal Medicine

## 2020-02-25 ENCOUNTER — Other Ambulatory Visit: Payer: Self-pay

## 2020-02-25 DIAGNOSIS — G4733 Obstructive sleep apnea (adult) (pediatric): Secondary | ICD-10-CM

## 2020-02-25 DIAGNOSIS — Z01812 Encounter for preprocedural laboratory examination: Secondary | ICD-10-CM | POA: Diagnosis not present

## 2020-02-25 DIAGNOSIS — Z20822 Contact with and (suspected) exposure to covid-19: Secondary | ICD-10-CM | POA: Diagnosis not present

## 2020-02-25 DIAGNOSIS — G4700 Insomnia, unspecified: Secondary | ICD-10-CM

## 2020-03-07 DIAGNOSIS — G4733 Obstructive sleep apnea (adult) (pediatric): Secondary | ICD-10-CM

## 2020-03-07 NOTE — Procedures (Signed)
   Patient Name: Garrett Wilson, Garrett Wilson Date: 02/25/2020 Gender: Male D.O.B: December 05, 1955 Age (years): 72 Referring Provider: Geradine Girt MD Height (inches): 24 Interpreting Physician: Baird Lyons MD, ABSM Weight (lbs): 195 RPSGT: Baxter Flattery BMI: 28 MRN: [id number] Neck Size: 16.00  CLINICAL INFORMATION Sleep Study Type: NPSG  Indication for sleep study: Congestive Heart Failure, Snoring, Witnessed Apnea / Gasping During Sleep, Question REM Behavior Sleep Disorder Epworth Sleepiness Score:  6  SLEEP STUDY TECHNIQUE As per the AASM Manual for the Scoring of Sleep and Associated Events v2.3 (April 2016) with a hypopnea requiring 4% desaturations.  The channels recorded and monitored were frontal, central and occipital EEG, electrooculogram (EOG), submentalis EMG (chin), nasal and oral airflow, thoracic and abdominal wall motion, anterior tibialis EMG, snore microphone, electrocardiogram, and pulse oximetry.  MEDICATIONS Medications self-administered by patient taken the night of the study : none reported  SLEEP ARCHITECTURE The study was initiated at 10:58:40 PM and ended at 5:02:54 AM.  Sleep onset time was 33.4 minutes and the sleep efficiency was 88.7%%. The total sleep time was 323 minutes.  Stage REM latency was 39.0 minutes.  The patient spent 2.5%% of the night in stage N1 sleep, 75.7%% in stage N2 sleep, 0.0%% in stage N3 and 21.8% in REM.  Alpha intrusion was absent.  Supine sleep was 90.58%.  RESPIRATORY PARAMETERS The overall apnea/hypopnea index (AHI) was 2.8 per hour. There were 7 total apneas, including 4 obstructive, 3 central and 0 mixed apneas. There were 8 hypopneas and 3 RERAs.  The AHI during Stage REM sleep was 3.4 per hour.  AHI while supine was 2.9 per hour.  The mean oxygen saturation was 94.2%. The minimum SpO2 during sleep was 90.0%.  loud snoring was noted during this study.  CARDIAC DATA The 2 lead EKG demonstrated sinus rhythm.  The mean heart rate was 53.9 beats per minute. Other EKG findings include: None.  LEG MOVEMENT DATA The total PLMS were 0 with a resulting PLMS index of 0.0. Associated arousal with leg movement index was 0.0 .  IMPRESSIONS - Occasional obstructive sleep apnea occurred during this study, within normal limits (AHI = 2.8/h). - No significant central sleep apnea occurred during this study (CAI = 0.6/h). - The patient had minimal or no oxygen desaturation during the study (Min O2 = 90.0%) - The patient snored with loud snoring volume. - No significant movement/ behavioral abnormalities noted during 70.5 minutes of REM sleep. - No cardiac abnormalities were noted during this study. - Clinically significant periodic limb movements did not occur during sleep. No significant associated arousals.  DIAGNOSIS - Primary snoring  RECOMMENDATIONS - Be careful with alcohol, sedatives and other CNS depressants that may worsen sleep apnea and disrupt normal sleep architecture. - Sleep hygiene should be reviewed to assess factors that may improve sleep quality. - Weight management and regular exercise should be initiated or continued if appropriate.  [Electronically signed] 03/07/2020 01:01 PM  Baird Lyons MD, [place], American Board of Sleep Medicine   NPI: [id number]                          [place], [place] [facility]  ELECTRONICALLY SIGNED ON:  03/07/2020, 12:56 PM [place] PH: [phone]   FX: [phone] [place]

## 2020-03-23 DIAGNOSIS — Z1211 Encounter for screening for malignant neoplasm of colon: Secondary | ICD-10-CM | POA: Diagnosis not present

## 2020-03-28 DIAGNOSIS — Z1159 Encounter for screening for other viral diseases: Secondary | ICD-10-CM | POA: Diagnosis not present

## 2020-03-31 DIAGNOSIS — D123 Benign neoplasm of transverse colon: Secondary | ICD-10-CM | POA: Diagnosis not present

## 2020-03-31 DIAGNOSIS — D122 Benign neoplasm of ascending colon: Secondary | ICD-10-CM | POA: Diagnosis not present

## 2020-03-31 DIAGNOSIS — Z1211 Encounter for screening for malignant neoplasm of colon: Secondary | ICD-10-CM | POA: Diagnosis not present

## 2020-04-04 DIAGNOSIS — D123 Benign neoplasm of transverse colon: Secondary | ICD-10-CM | POA: Diagnosis not present

## 2020-04-04 DIAGNOSIS — D122 Benign neoplasm of ascending colon: Secondary | ICD-10-CM | POA: Diagnosis not present

## 2020-07-02 ENCOUNTER — Ambulatory Visit
Admission: EM | Admit: 2020-07-02 | Discharge: 2020-07-02 | Disposition: A | Discharge status: Home / Self Care | Payer: Medicare HMO | Attending: Emergency Medicine | Admitting: Emergency Medicine

## 2020-07-02 ENCOUNTER — Other Ambulatory Visit: Payer: Self-pay

## 2020-07-02 ENCOUNTER — Encounter: Payer: Self-pay | Admitting: Emergency Medicine

## 2020-07-02 DIAGNOSIS — S51812A Laceration without foreign body of left forearm, initial encounter: Secondary | ICD-10-CM

## 2020-07-02 NOTE — ED Triage Notes (Signed)
Pt presents to [facility] for assessment of laceration to left wrist occurring PTA.  Last tetanus 2 years ago

## 2020-07-02 NOTE — Discharge Instructions (Signed)
Keep area clean and dry. Return for worsening pain, swelling, redness, discharge, fever.

## 2020-07-02 NOTE — ED Provider Notes (Signed)
[facility]    CSN: [id number] Arrival date & time: 07/02/20  1519      History   Chief Complaint Chief Complaint  Patient presents with  . Laceration    HPI Garrett Wilson is a 64 y.o. male presenting for laceration to left forearm.  States this occurred shortly PTA: Unintentional.  Denies SI/HI.  States he was try to cut open a box with a razor blade.  Last tetanus 2 years ago.  Has pressure dressing applied: No numbness and is able to move all fingers.    Past Medical History:  Diagnosis Date  . Arthritis   . Back pain, chronic   . Headache(784.0)   . HOH (hard of hearing)   . Insomnia   . Prostate cancer ([place])    2010    There are no problems to display for this patient.   Past Surgical History:  Procedure Laterality Date  . ELBOW SURGERY     Right and Left Elbow  . FOOT SURGERY     fx rt 2nd toe  . GANGLION CYST EXCISION  06/18/2012   Procedure: REMOVAL GANGLION OF WRIST;  Surgeon: Alta Corning, MD;  Location: [facility];  Service: Orthopedics;  Laterality: Right;  . HERNIA REPAIR     Esophageal Stretching  . INSERTION PROSTATE RADIATION SEED    . KNEE ARTHROSCOPY     right and left knee scoped  . MANDIBLE FRACTURE SURGERY     fx lt cheek       Home Medications    Prior to Admission medications   Medication Sig Start Date End Date Taking? Authorizing Provider  Ascorbic Acid (VITAMIN C) 1000 MG tablet Take 1,000 mg by mouth daily.    [provider]  Cyanocobalamin (VITAMIN B-12) 2500 MCG SUBL Place 1 tablet under the tongue daily.      [provider]  Garlic 1962 MG CAPS Take 2 capsules by mouth daily.      [provider]  Omega-3 Fatty Acids (FISH OIL) 1000 MG CAPS Take 2 capsules by mouth daily.      [provider]  OVER THE COUNTER MEDICATION Take 1 packet by mouth daily. Multivitamin Packet     [provider]  OVER THE COUNTER MEDICATION Take 1 capsule by mouth  daily. Chaga Supplement    [provider]  vitamin E 1000 UNIT capsule Take 1,000 Units by mouth daily.    [provider]    Family History History reviewed. No pertinent family history.  Social History Social History   Tobacco Use  . Smoking status: Never Smoker  . Smokeless tobacco: Never Used  Substance Use Topics  . Alcohol use: No  . Drug use: No     Allergies   Lovastatin   Review of Systems As per HPI   Physical Exam Triage Vital Signs ED Triage Vitals  Enc Vitals Group     BP 07/02/20 1528 123/82     Pulse Rate 07/02/20 1528 83     Resp 07/02/20 1528 18     Temp 07/02/20 1528 98.1 F (36.7 C)     Temp Source 07/02/20 1528 Oral     SpO2 07/02/20 1528 95 %     Weight --      Height --      Head Circumference --      Peak Flow --      Pain Score 07/02/20 1526 7  Pain Loc --      Pain Edu? --      Excl. in [place]? --    No data found.  Updated Vital Signs BP 123/82 (BP Location: Left Arm)   Pulse 83   Temp 98.1 F (36.7 C) (Oral)   Resp 18   SpO2 95%   Visual Acuity Right Eye Distance:   Left Eye Distance:   Bilateral Distance:    Right Eye Near:   Left Eye Near:    Bilateral Near:     Physical Exam Constitutional:      General: He is not in acute distress. HENT:     Head: Normocephalic and atraumatic.  Eyes:     General: No scleral icterus.    Pupils: Pupils are equal, round, and reactive to light.  Cardiovascular:     Rate and Rhythm: Normal rate.  Pulmonary:     Effort: Pulmonary effort is normal. No respiratory distress.     Breath sounds: No wheezing.  Musculoskeletal:        General: Tenderness and signs of injury present. No swelling. Normal range of motion.  Skin:    Coloration: Skin is not jaundiced or pale.     Comments: 2 cm laceration noted to left forearm, ventral aspect.    Neurological:     Mental Status: He is alert and oriented to person, place, and time.      UC Treatments / Results   Labs (all labs ordered are listed, but only abnormal results are displayed) Labs Reviewed - No data to display  EKG   Radiology No results found.  Procedures Laceration Repair  Date/Time: 07/03/2020 9:31 AM Performed by: Quincy Sheehan, PA-C Authorized by: Quincy Sheehan, PA-C   Consent:    Consent obtained:  Verbal   Consent given by:  Patient   Risks discussed:  Infection, need for additional repair, pain, poor cosmetic result and poor wound healing   Alternatives discussed:  No treatment and delayed treatment Universal protocol:    Patient identity confirmed:  Verbally with patient Anesthesia (see MAR for exact dosages):    Anesthesia method:  Local infiltration   Local anesthetic:  Lidocaine 2% w/o epi Laceration details:    Location:  Shoulder/arm   Shoulder/arm location:  L lower arm   Length (cm):  2   Depth (mm):  7 Repair type:    Repair type:  Simple Pre-procedure details:    Preparation:  Patient was prepped and draped in usual sterile fashion Exploration:    Hemostasis achieved with:  Direct pressure   Wound exploration: wound explored through full range of motion     Wound extent: no areolar tissue violation noted, no fascia violation noted and no tendon damage noted     Contaminated: no   Treatment:    Area cleansed with:  Soap and water   Amount of cleaning:  Extensive   Irrigation solution:  Tap water   Irrigation volume:  5 min   Irrigation method:  Tap Skin repair:    Repair method:  Sutures   Suture size:  4-0   Suture material:  Prolene   Suture technique:  Simple interrupted   Number of sutures:  3 Approximation:    Approximation:  Close Post-procedure details:    Dressing:  Bulky dressing   Patient tolerance of procedure:  Tolerated well, no immediate complications   (including critical care time)  Medications Ordered in UC Medications - No data to display  Initial Impression /  Assessment and Plan / UC Course  I have  reviewed the triage vital signs and the nursing notes.  Pertinent labs & imaging results that were available during my care of the patient were reviewed by me and considered in my medical decision making (see chart for details).     There are simple interrupted sutures placed: Patient tolerated well.  Thoroughly irrigated and patient is immunocompetent: No indication for antibiotics at this time.  Will return in 7-10 days for suture removal.  Return precautions discussed, pt verbalized understanding and is agreeable to plan. Final Clinical Impressions(s) / UC Diagnoses   Final diagnoses:  Laceration of left forearm, initial encounter     Discharge Instructions     Keep area clean and dry. Return for worsening pain, swelling, redness, discharge, fever.    ED Prescriptions    None     PDMP not reviewed this encounter.   [place], [place], [place] 07/03/20 [phone]

## 2020-07-03 ENCOUNTER — Encounter: Payer: Self-pay | Admitting: Emergency Medicine

## 2020-07-03 DIAGNOSIS — S51812A Laceration without foreign body of left forearm, initial encounter: Secondary | ICD-10-CM | POA: Diagnosis not present

## 2020-07-11 ENCOUNTER — Ambulatory Visit
Admission: EM | Admit: 2020-07-11 | Discharge: 2020-07-11 | Disposition: A | Discharge status: Home / Self Care | Payer: Medicare HMO | Attending: Emergency Medicine | Admitting: Emergency Medicine

## 2020-07-11 ENCOUNTER — Other Ambulatory Visit: Payer: Self-pay

## 2020-07-11 NOTE — ED Triage Notes (Signed)
Removed 3 sutures from lt forearm. No s/sx's of infection.

## 2020-09-08 DIAGNOSIS — Z0000 Encounter for general adult medical examination without abnormal findings: Secondary | ICD-10-CM | POA: Diagnosis not present

## 2020-09-08 DIAGNOSIS — Z1389 Encounter for screening for other disorder: Secondary | ICD-10-CM | POA: Diagnosis not present

## 2020-09-08 DIAGNOSIS — N529 Male erectile dysfunction, unspecified: Secondary | ICD-10-CM | POA: Diagnosis not present

## 2020-09-08 DIAGNOSIS — M4316 Spondylolisthesis, lumbar region: Secondary | ICD-10-CM | POA: Diagnosis not present

## 2020-09-08 DIAGNOSIS — K219 Gastro-esophageal reflux disease without esophagitis: Secondary | ICD-10-CM | POA: Diagnosis not present

## 2020-09-08 DIAGNOSIS — Z8546 Personal history of malignant neoplasm of prostate: Secondary | ICD-10-CM | POA: Diagnosis not present

## 2020-09-08 DIAGNOSIS — Z23 Encounter for immunization: Secondary | ICD-10-CM | POA: Diagnosis not present

## 2020-09-08 DIAGNOSIS — E782 Mixed hyperlipidemia: Secondary | ICD-10-CM | POA: Diagnosis not present

## 2020-09-19 DIAGNOSIS — N5201 Erectile dysfunction due to arterial insufficiency: Secondary | ICD-10-CM | POA: Diagnosis not present

## 2020-09-19 DIAGNOSIS — Z8546 Personal history of malignant neoplasm of prostate: Secondary | ICD-10-CM | POA: Diagnosis not present

## 2020-09-29 DIAGNOSIS — Z20822 Contact with and (suspected) exposure to covid-19: Secondary | ICD-10-CM | POA: Diagnosis not present

## 2020-11-09 ENCOUNTER — Emergency Department (HOSPITAL_COMMUNITY): Payer: Medicare HMO

## 2020-11-09 ENCOUNTER — Other Ambulatory Visit: Payer: Self-pay

## 2020-11-09 ENCOUNTER — Emergency Department (HOSPITAL_COMMUNITY)
Admission: EM | Admit: 2020-11-09 | Discharge: 2020-11-09 | Disposition: A | Discharge status: Home / Self Care | Payer: Medicare HMO | Attending: Emergency Medicine | Admitting: Emergency Medicine

## 2020-11-09 DIAGNOSIS — Y92410 Unspecified street and highway as the place of occurrence of the external cause: Secondary | ICD-10-CM | POA: Insufficient documentation

## 2020-11-09 DIAGNOSIS — S161XXA Strain of muscle, fascia and tendon at neck level, initial encounter: Secondary | ICD-10-CM | POA: Diagnosis not present

## 2020-11-09 DIAGNOSIS — M545 Low back pain, unspecified: Secondary | ICD-10-CM | POA: Diagnosis not present

## 2020-11-09 DIAGNOSIS — Z8546 Personal history of malignant neoplasm of prostate: Secondary | ICD-10-CM | POA: Diagnosis not present

## 2020-11-09 DIAGNOSIS — M542 Cervicalgia: Secondary | ICD-10-CM | POA: Insufficient documentation

## 2020-11-09 DIAGNOSIS — M549 Dorsalgia, unspecified: Secondary | ICD-10-CM | POA: Insufficient documentation

## 2020-11-09 NOTE — Discharge Instructions (Addendum)
Take Tylenol or Motrin for pain and follow-up with the family doctor if not improving
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
## 2020-11-10 DIAGNOSIS — M5416 Radiculopathy, lumbar region: Secondary | ICD-10-CM | POA: Diagnosis not present

## 2020-11-15 DIAGNOSIS — Z833 Family history of diabetes mellitus: Secondary | ICD-10-CM | POA: Diagnosis not present

## 2020-11-15 DIAGNOSIS — R030 Elevated blood-pressure reading, without diagnosis of hypertension: Secondary | ICD-10-CM | POA: Diagnosis not present

## 2020-11-15 DIAGNOSIS — N529 Male erectile dysfunction, unspecified: Secondary | ICD-10-CM | POA: Diagnosis not present

## 2020-11-15 DIAGNOSIS — Z809 Family history of malignant neoplasm, unspecified: Secondary | ICD-10-CM | POA: Diagnosis not present

## 2020-11-15 DIAGNOSIS — Z823 Family history of stroke: Secondary | ICD-10-CM | POA: Diagnosis not present

## 2020-11-15 DIAGNOSIS — Z8546 Personal history of malignant neoplasm of prostate: Secondary | ICD-10-CM | POA: Diagnosis not present

## 2020-11-15 DIAGNOSIS — E785 Hyperlipidemia, unspecified: Secondary | ICD-10-CM | POA: Diagnosis not present

## 2020-11-15 DIAGNOSIS — I739 Peripheral vascular disease, unspecified: Secondary | ICD-10-CM | POA: Diagnosis not present

## 2020-11-15 DIAGNOSIS — Z8249 Family history of ischemic heart disease and other diseases of the circulatory system: Secondary | ICD-10-CM | POA: Diagnosis not present

## 2020-11-15 DIAGNOSIS — M1990 Unspecified osteoarthritis, unspecified site: Secondary | ICD-10-CM | POA: Diagnosis not present

## 2020-11-15 DIAGNOSIS — Z008 Encounter for other general examination: Secondary | ICD-10-CM | POA: Diagnosis not present

## 2020-11-21 DIAGNOSIS — M545 Low back pain, unspecified: Secondary | ICD-10-CM | POA: Diagnosis not present

## 2020-11-28 DIAGNOSIS — N50811 Right testicular pain: Secondary | ICD-10-CM | POA: Diagnosis not present

## 2020-11-28 DIAGNOSIS — M5441 Lumbago with sciatica, right side: Secondary | ICD-10-CM | POA: Diagnosis not present

## 2020-11-29 DIAGNOSIS — M25551 Pain in right hip: Secondary | ICD-10-CM | POA: Diagnosis not present

## 2020-11-29 DIAGNOSIS — M5441 Lumbago with sciatica, right side: Secondary | ICD-10-CM | POA: Diagnosis not present

## 2020-11-29 DIAGNOSIS — M545 Low back pain, unspecified: Secondary | ICD-10-CM | POA: Diagnosis not present

## 2020-12-12 DIAGNOSIS — M545 Low back pain, unspecified: Secondary | ICD-10-CM | POA: Diagnosis not present

## 2020-12-12 DIAGNOSIS — S335XXD Sprain of ligaments of lumbar spine, subsequent encounter: Secondary | ICD-10-CM | POA: Diagnosis not present

## 2020-12-14 DIAGNOSIS — M545 Low back pain, unspecified: Secondary | ICD-10-CM | POA: Diagnosis not present

## 2020-12-14 DIAGNOSIS — S335XXD Sprain of ligaments of lumbar spine, subsequent encounter: Secondary | ICD-10-CM | POA: Diagnosis not present

## 2020-12-21 DIAGNOSIS — S335XXD Sprain of ligaments of lumbar spine, subsequent encounter: Secondary | ICD-10-CM | POA: Diagnosis not present

## 2020-12-21 DIAGNOSIS — M545 Low back pain, unspecified: Secondary | ICD-10-CM | POA: Diagnosis not present

## 2020-12-29 DIAGNOSIS — M545 Low back pain, unspecified: Secondary | ICD-10-CM | POA: Diagnosis not present

## 2020-12-30 ENCOUNTER — Other Ambulatory Visit (HOSPITAL_COMMUNITY): Payer: Self-pay | Admitting: Internal Medicine

## 2020-12-30 DIAGNOSIS — M545 Low back pain, unspecified: Secondary | ICD-10-CM

## 2021-01-07 ENCOUNTER — Encounter (HOSPITAL_COMMUNITY): Payer: Self-pay

## 2021-01-07 ENCOUNTER — Ambulatory Visit (HOSPITAL_COMMUNITY): Admission: RE | Admit: 2021-01-07 | Payer: Medicare HMO | Source: Ambulatory Visit

## 2021-01-28 ENCOUNTER — Ambulatory Visit (HOSPITAL_BASED_OUTPATIENT_CLINIC_OR_DEPARTMENT_OTHER)
Admission: RE | Admit: 2021-01-28 | Discharge: 2021-01-28 | Disposition: A | Discharge status: Home / Self Care | Payer: Medicare HMO | Source: Ambulatory Visit | Attending: Internal Medicine | Admitting: Internal Medicine

## 2021-01-28 ENCOUNTER — Other Ambulatory Visit: Payer: Self-pay

## 2021-01-28 DIAGNOSIS — M545 Low back pain, unspecified: Secondary | ICD-10-CM | POA: Diagnosis not present

## 2021-01-28 DIAGNOSIS — M7138 Other bursal cyst, other site: Secondary | ICD-10-CM | POA: Diagnosis not present

## 2021-01-28 DIAGNOSIS — M48061 Spinal stenosis, lumbar region without neurogenic claudication: Secondary | ICD-10-CM | POA: Insufficient documentation

## 2021-01-28 DIAGNOSIS — M4317 Spondylolisthesis, lumbosacral region: Secondary | ICD-10-CM | POA: Diagnosis not present

## 2021-02-21 DIAGNOSIS — E785 Hyperlipidemia, unspecified: Secondary | ICD-10-CM | POA: Diagnosis not present

## 2021-02-21 DIAGNOSIS — G8929 Other chronic pain: Secondary | ICD-10-CM | POA: Diagnosis not present

## 2021-02-21 DIAGNOSIS — G63 Polyneuropathy in diseases classified elsewhere: Secondary | ICD-10-CM | POA: Diagnosis not present

## 2021-02-21 DIAGNOSIS — C61 Malignant neoplasm of prostate: Secondary | ICD-10-CM | POA: Diagnosis not present

## 2021-02-21 DIAGNOSIS — Z800 Family history of malignant neoplasm of digestive organs: Secondary | ICD-10-CM | POA: Diagnosis not present

## 2021-02-21 DIAGNOSIS — Z809 Family history of malignant neoplasm, unspecified: Secondary | ICD-10-CM | POA: Diagnosis not present

## 2021-02-21 DIAGNOSIS — I739 Peripheral vascular disease, unspecified: Secondary | ICD-10-CM | POA: Diagnosis not present

## 2021-02-21 DIAGNOSIS — M1990 Unspecified osteoarthritis, unspecified site: Secondary | ICD-10-CM | POA: Diagnosis not present

## 2021-02-21 DIAGNOSIS — H547 Unspecified visual loss: Secondary | ICD-10-CM | POA: Diagnosis not present

## 2021-02-21 DIAGNOSIS — N529 Male erectile dysfunction, unspecified: Secondary | ICD-10-CM | POA: Diagnosis not present

## 2021-03-07 DIAGNOSIS — T148XXA Other injury of unspecified body region, initial encounter: Secondary | ICD-10-CM | POA: Diagnosis not present

## 2021-03-12 ENCOUNTER — Ambulatory Visit
Admission: EM | Admit: 2021-03-12 | Discharge: 2021-03-12 | Disposition: A | Discharge status: Home / Self Care | Payer: Medicare HMO | Attending: Emergency Medicine | Admitting: Emergency Medicine

## 2021-03-12 ENCOUNTER — Other Ambulatory Visit: Payer: Self-pay

## 2021-03-12 DIAGNOSIS — R110 Nausea: Secondary | ICD-10-CM | POA: Diagnosis not present

## 2021-03-12 DIAGNOSIS — R1030 Lower abdominal pain, unspecified: Secondary | ICD-10-CM

## 2021-03-12 DIAGNOSIS — R509 Fever, unspecified: Secondary | ICD-10-CM

## 2021-03-12 MED ORDER — DICYCLOMINE HCL 20 MG PO TABS: 20.0000 mg | ORAL_TABLET | Freq: Three times a day (TID) | ORAL | 0 refills | Status: AC

## 2021-03-12 MED ORDER — ONDANSETRON 4 MG PO TBDP: 4.0000 mg | ORAL_TABLET | Freq: Three times a day (TID) | ORAL | 0 refills | Status: AC | PRN

## 2021-03-12 NOTE — ED Triage Notes (Signed)
Pt presents with abdominal pain and nausea for past week, pt began feeling bad after eating at [employer], reports chill and nausea, denies vomiting or bowel changes,

## 2021-03-12 NOTE — Discharge Instructions (Addendum)
Continue Tylenol as needed for any fevers, headache Drink plenty of fluids Zofran dissolved in mouth as needed for nausea May try Bentyl before meals and bedtime as needed for abdominal pain/cramping Please monitor for continued improvement over the next 48 hours If pain worsening or symptoms changing please follow-up

## 2021-03-12 NOTE — ED Provider Notes (Signed)
[facility]    CSN: [id number] Arrival date & time: 03/12/21  0903      History   Chief Complaint No chief complaint on file. Headache, nausea  HPI Garrett Wilson is a 65 y.o. male history of prostate cancer presenting today for evaluation abdominal pain and nausea.  Reports symptoms began over the past 2 to 3 days.  Reports symptoms initially began after eating at [facility] and was feeling chilled and nausea.  Reports fevers up to 102 which have since resolved.  Denies any vomiting or change in bowel movements.  Last bowel movement on Thursday.  Pain worsens with eating as well as decreased appetite and nausea.  Pain mainly in lower abdomen and describes as a stabbing sensation.  Denies any close sick contacts.  Denies URI symptoms.  Denies history of GI problems or any prior abdominal surgeries.  HPI  Past Medical History:  Diagnosis Date  . Arthritis   . Back pain, chronic   . Headache(784.0)   . HOH (hard of hearing)   . Insomnia   . Prostate cancer (Garrett Wilson)    2010    There are no problems to display for this patient.   Past Surgical History:  Procedure Laterality Date  . ELBOW SURGERY     Right and Left Elbow  . FOOT SURGERY     fx rt 2nd toe  . GANGLION CYST EXCISION  06/18/2012   Procedure: REMOVAL GANGLION OF WRIST;  Surgeon: Alta Corning, MD;  Location: [place];  Service: Orthopedics;  Laterality: Right;  . HERNIA REPAIR     Esophageal Stretching  . INSERTION PROSTATE RADIATION SEED    . KNEE ARTHROSCOPY     right and left knee scoped  . MANDIBLE FRACTURE SURGERY     fx lt cheek       Home Medications    Prior to Admission medications   Medication Sig Start Date End Date Taking? Authorizing Provider  dicyclomine (BENTYL) 20 MG tablet Take 1 tablet (20 mg total) by mouth 4 (four) times daily -  before meals and at bedtime. 03/12/21  Yes Nicholaus Steinke C, PA-C  ondansetron (ZOFRAN ODT) 4 MG disintegrating tablet Take 1  tablet (4 mg total) by mouth every 8 (eight) hours as needed for nausea or vomiting. 03/12/21  Yes Devere Brem C, PA-C  Ascorbic Acid (VITAMIN C) 1000 MG tablet Take 1,000 mg by mouth daily.    [provider]  Cyanocobalamin (VITAMIN B-12) 2500 MCG SUBL Place 1 tablet under the tongue daily.      [provider]  Garlic 2992 MG CAPS Take 2 capsules by mouth daily.      [provider]  Omega-3 Fatty Acids (FISH OIL) 1000 MG CAPS Take 2 capsules by mouth daily.      [provider]  OVER THE COUNTER MEDICATION Take 1 packet by mouth daily. Multivitamin Packet     [provider]  OVER THE COUNTER MEDICATION Take 1 capsule by mouth daily. [place]aga Supplement    [provider]  vitamin E 1000 UNIT capsule Take 1,000 Units by mouth daily.    [provider]    Family History Family History  Family history unknown: Yes    Social History Social History   Tobacco Use  . Smoking status: Never Smoker  . Smokeless tobacco: Never Used  Substance Use Topics  . Alcohol use: No  . Drug use: No     Allergies  Lovastatin   Review of Systems Review of Systems  Constitutional: Positive for chills and fever. Negative for fatigue.  Eyes: Negative for redness, itching and visual disturbance.  Respiratory: Negative for shortness of breath.   Cardiovascular: Negative for chest pain and leg swelling.  Gastrointestinal: Positive for abdominal pain and nausea. Negative for vomiting.  Musculoskeletal: Negative for arthralgias and myalgias.  Skin: Negative for color change, rash and wound.  Neurological: Negative for dizziness, syncope, weakness, light-headedness and headaches.     Physical Exam Triage Vital Signs ED Triage Vitals  Enc Vitals Group     BP 03/12/21 0939 107/66     Pulse Rate 03/12/21 0939 68     Resp 03/12/21 0939 20     Temp 03/12/21 0939 98.2 F (36.8 C)     Temp Source 03/12/21 0939 Oral     SpO2 03/12/21  0939 95 %     Weight --      Height --      Head Circumference --      Peak Flow --      Pain Score 03/12/21 0944 5     Pain Loc --      Pain Edu? --      Excl. in [place]? --    No data found.  Updated Vital Signs BP 107/66 (BP Location: Left Arm)   Pulse 68   Temp 98.2 F (36.8 C) (Oral)   Resp 20   SpO2 95%   Visual Acuity Right Eye Distance:   Left Eye Distance:   Bilateral Distance:    Right Eye Near:   Left Eye Near:    Bilateral Near:     Physical Exam Vitals and nursing note reviewed.  Constitutional:      Appearance: He is well-developed.     Comments: No acute distress  HENT:     Head: Normocephalic and atraumatic.     Nose: Nose normal.  Eyes:     Conjunctiva/sclera: Conjunctivae normal.  Cardiovascular:     Rate and Rhythm: Normal rate and regular rhythm.  Pulmonary:     Effort: Pulmonary effort is normal. No respiratory distress.     Comments: Breathing comfortably at rest, CTABL, no wheezing, rales or other adventitious sounds auscultated Abdominal:     General: There is no distension.     Comments: Soft, nondistended, tender to palpation to bilateral lower quadrants, negative rebound, negative Rovsing, negative McBurney's, negative Murphy's  Musculoskeletal:        General: Normal range of motion.     Cervical back: Neck supple.  Skin:    General: Skin is warm and dry.  Neurological:     Mental Status: He is alert and oriented to person, place, and time.      UC Treatments / Results  Labs (all labs ordered are listed, but only abnormal results are displayed) Labs Reviewed  NOVEL CORONAVIRUS, NAA    EKG   Radiology No results found.  Procedures Procedures (including critical care time)  Medications Ordered in UC Medications - No data to display  Initial Impression / Assessment and Plan / UC Course  I have reviewed the triage vital signs and the nursing notes.  Pertinent labs & imaging results that were available during my care  of the patient were reviewed by me and considered in my medical decision making (see chart for details).     Abdominal pain with nausea with associated fever and chills-vital signs stable today, does feel symptoms have improved over  the past 24 hours, will screen for Covid, and treat symptomatically and supportively for likely viral etiology with close monitoring.  If developing worsening pain to follow-up in emergency room.  Treating with Zofran and Bentyl and push fluids.  Monitor for bowels to return to normal.  Lower suspicion of obstruction at this time, but advised if not developing normal bowel movements over the next 48 hours to follow-up as well.  Discussed strict return precautions. Patient verbalized understanding and is agreeable with plan.  Final Clinical Impressions(s) / UC Diagnoses   Final diagnoses:  Fever, unspecified  Lower abdominal pain  Nausea without vomiting     Discharge Instructions     Continue Tylenol as needed for any fevers, headache Drink plenty of fluids Zofran dissolved in mouth as needed for nausea May try Bentyl before meals and bedtime as needed for abdominal pain/cramping Please monitor for continued improvement over the next 48 hours If pain worsening or symptoms changing please follow-up    ED Prescriptions    Medication Sig Dispense Auth. Provider   ondansetron (ZOFRAN ODT) 4 MG disintegrating tablet Take 1 tablet (4 mg total) by mouth every 8 (eight) hours as needed for nausea or vomiting. 20 tablet Rozelle Caudle C, PA-C   dicyclomine (BENTYL) 20 MG tablet Take 1 tablet (20 mg total) by mouth 4 (four) times daily -  before meals and at bedtime. 20 tablet Derron Pipkins, [place] C, PA-C     PDMP not reviewed this encounter.   Janith Lima, [place] 03/12/21 [phone]

## 2021-03-13 LAB — SARS-COV-2, NAA 2 DAY TAT

## 2021-03-13 LAB — NOVEL CORONAVIRUS, NAA: SARS-CoV-2, NAA: NOT DETECTED

## 2021-04-14 IMAGING — MR MR LUMBAR SPINE W/O CM
5 of 6 series · 28 of 48 positions shown · non-contrast
Comparison: 11/09/2020 and prior.

CLINICAL DATA: Lower back pain.

EXAM:
MRI LUMBAR SPINE WITHOUT CONTRAST
TECHNIQUE: Multiplanar, multisequence MR imaging of the lumbar spine was
performed. No intravenous contrast was administered.

[Series 2: T1 · sagittal · 4.0mm · 0.81mm/px · 6 of 15 slices shown (1 of 2)]
[im 1/15]
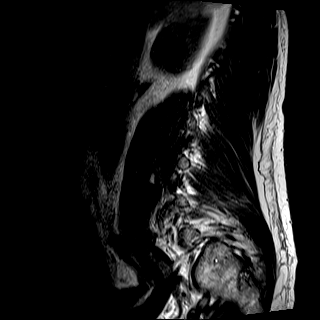
[im 3/15]
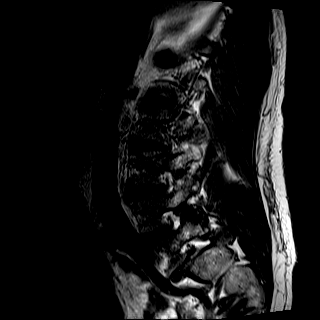
[im 6/15]
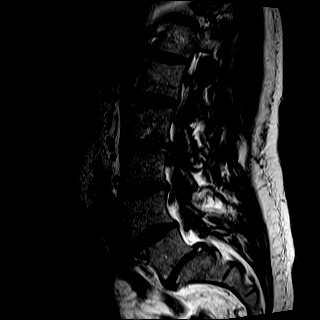
[im 9/15]
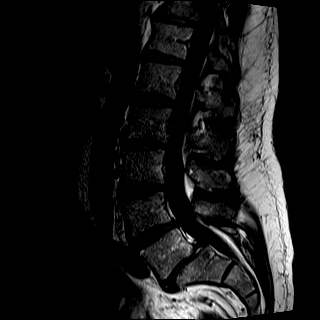
[im 12/15]
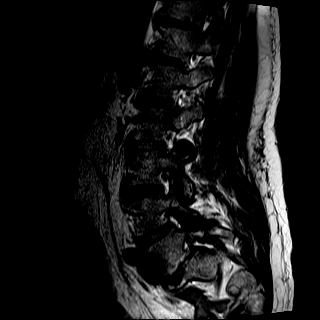
[im 15/15]
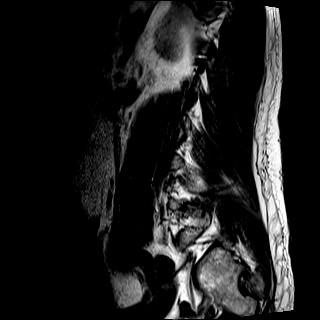

[Series 3: T2 · sagittal · 4.0mm · 0.81mm/px · 6 of 15 slices shown (1 of 3)]
[im 1/15]
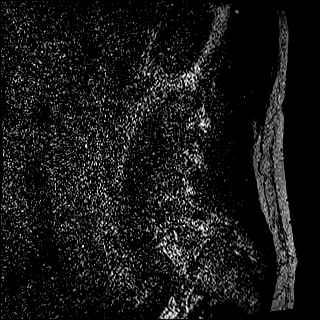
[im 3/15]
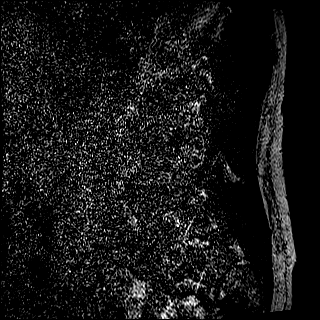
[im 6/15]
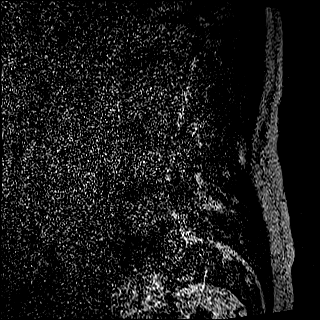
[im 9/15]
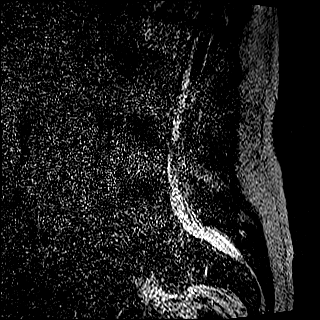
[im 12/15]
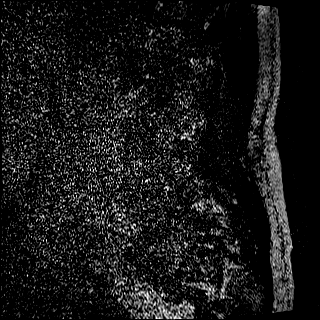
[im 15/15]
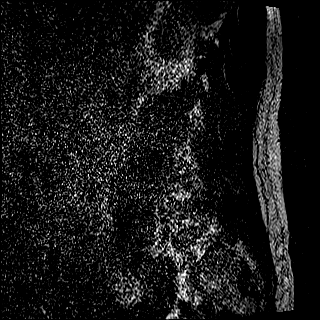

[Series 5: T2 · sagittal · 4.0mm · 0.81mm/px · 5 of 15 slices shown (2 of 3)]
[im 1/15]
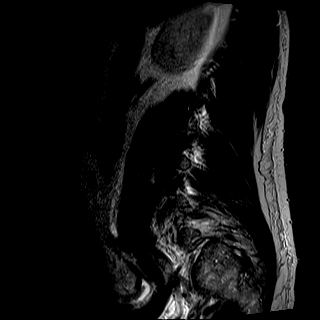
[im 4/15]
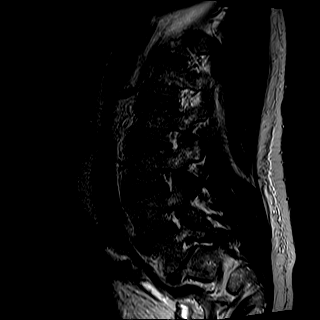
[im 8/15]
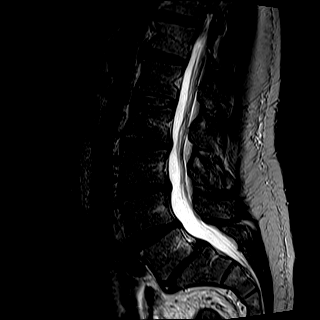
[im 11/15]
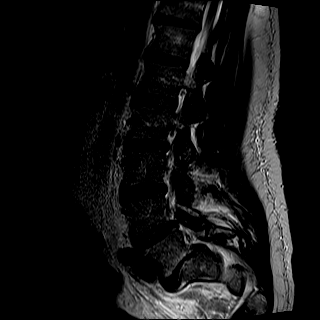
[im 15/15]
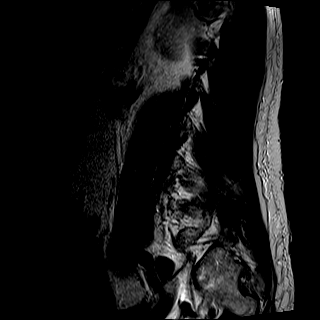

[Series 6: T2 · axial · 4.0mm · 0.39mm/px · z∈[-98,+134]mm · 9 of 38 slices shown (3 of 3)]
[im 1/38]
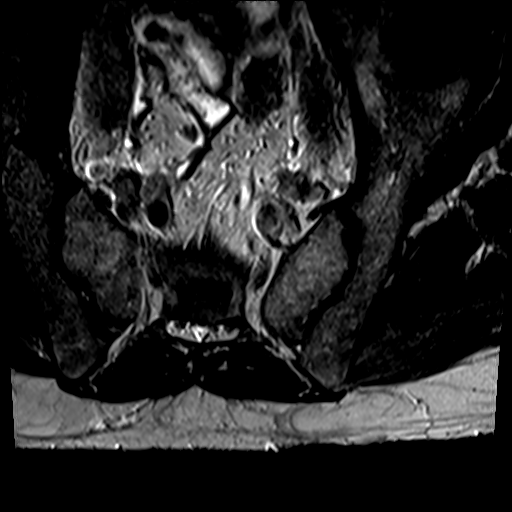
[im 7/38]
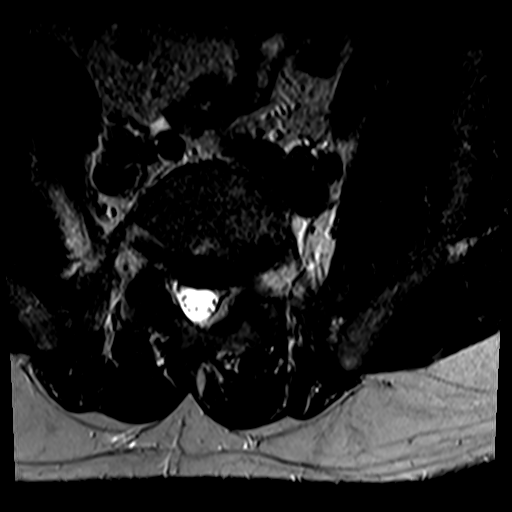
[im 13/38]
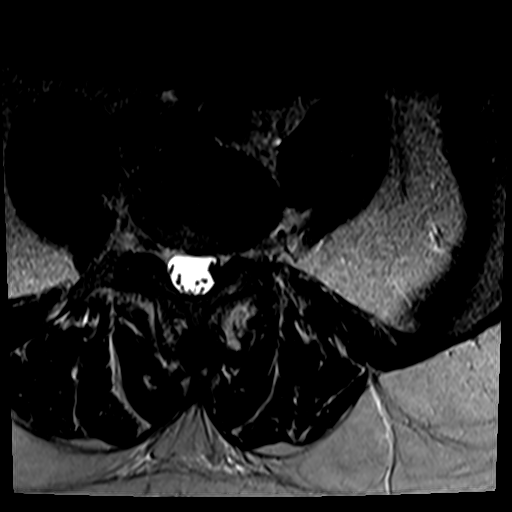
[im 16/38]
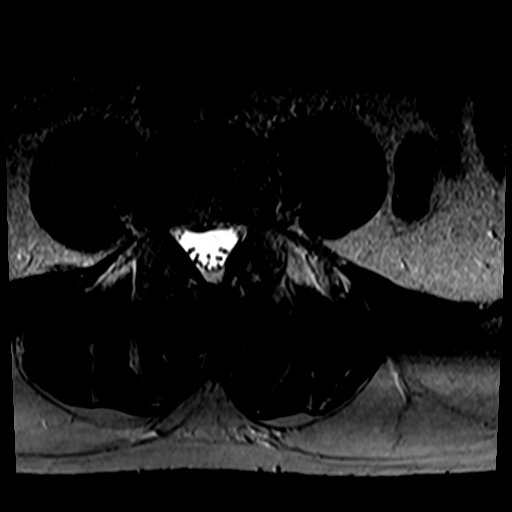
[im 19/38]
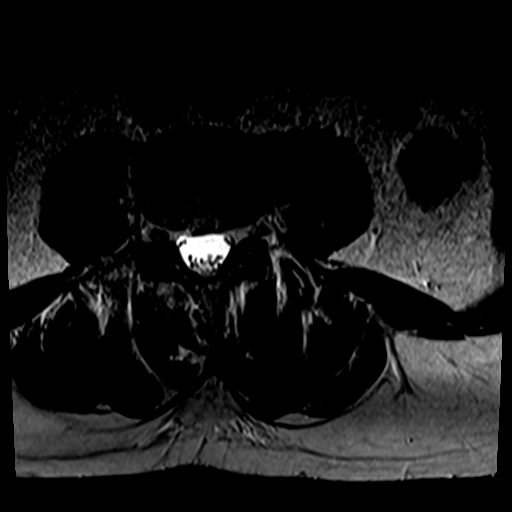
[im 22/38]
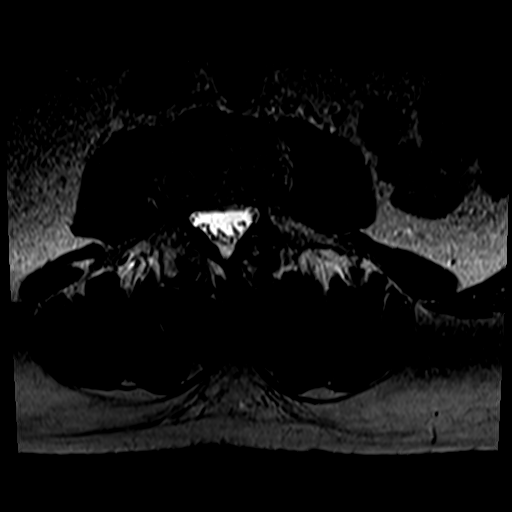
[im 25/38]
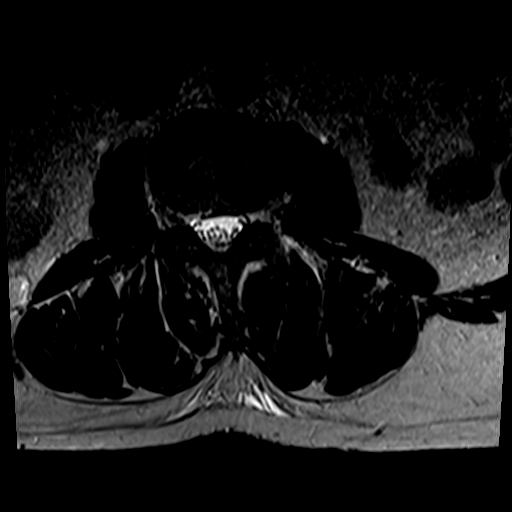
[im 31/38]
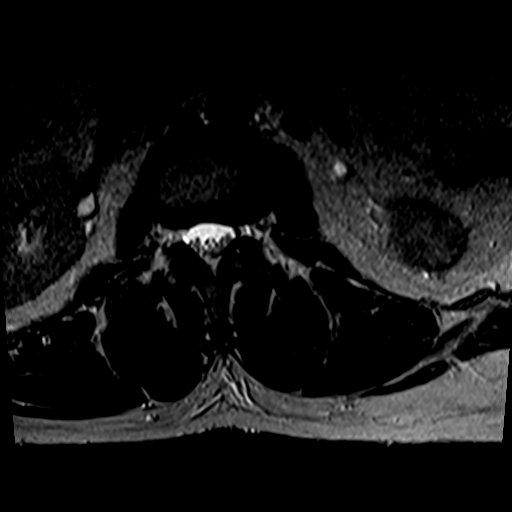
[im 38/38]
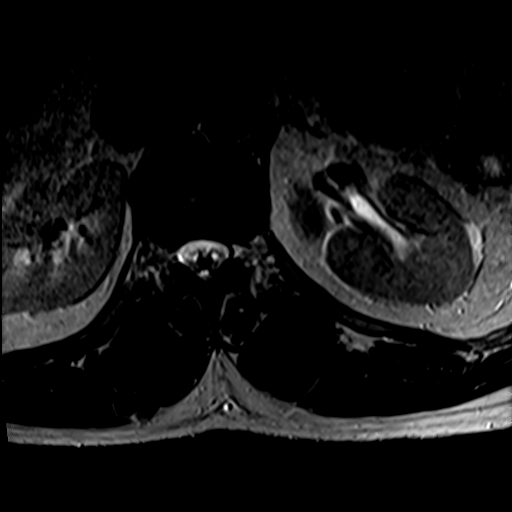

[Series 7: T1 · axial · 4.0mm · 0.39mm/px · z∈[-98,-72]mm · 2 of 38 slices shown (2 of 2)]
[im 1/38]
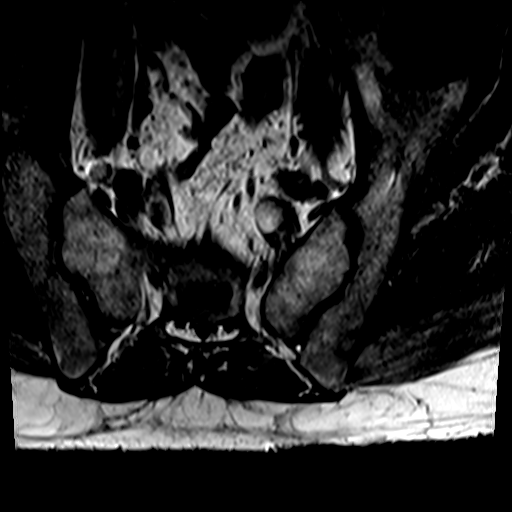
[im 7/38]
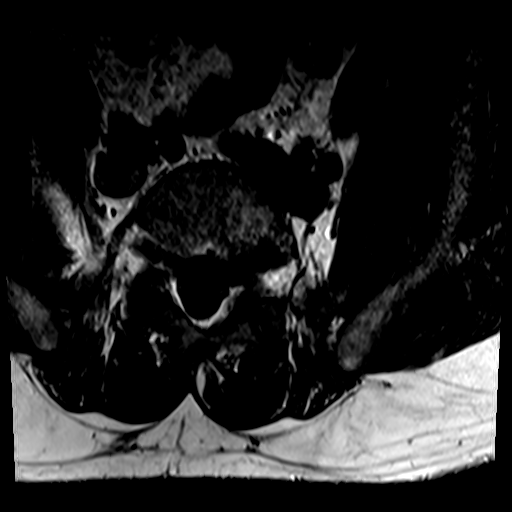

[28 of 48 positions shown; findings below may reference images not displayed]

FINDINGS: Segmentation:  Standard.

Alignment: Bilateral L5-S1 pars interarticularis defects with grade
2 anterolisthesis of 1.1 cm.

Vertebrae: Minimal Modic type 2 endplate degenerative changes.
Vertebral body heights are preserved. No aggressive osseous lesion.

Conus medullaris and cauda equina: Conus extends to the L1 level.
Conus and cauda equina appear normal.

Disc levels: Multilevel desiccation. No significant disc bulge.
Facet degenerative spurring. Patent spinal canal and neural foramen.

L1-2: No significant disc bulge. Patent spinal canal and neural
foramen.

L2-3: Mild disc bulge with far left lateral protrusion. Small
superimposed left foraminal protrusion. Bilateral facet hypertrophy.
Mild spinal canal and bilateral neural foraminal narrowing.

L3-4: Disc bulge with shallow right paracentral and left foraminal
protrusions. There is abutment of the descending L4 nerve roots.
Bilateral facet hypertrophy. Patent spinal canal. Mild bilateral
neural foraminal narrowing.

L4-5 disc bulge with shallow central and far left lateral
protrusions. Bilateral facet hypertrophy. 4 mm synovial cyst arising
from the left facet joint abutting the descending L5 nerve root.
Patent spinal canal and right neural foramen. Mild left neural
foraminal narrowing.

L5-S1: Grade 2 anterolisthesis with uncovered bulge and central
protrusion abutting the descending S1 nerve roots. Bilateral facet
hypertrophy. Mild spinal canal and bilateral neural foraminal
narrowing.

Paraspinal and other soft tissues: Negative.
IMPRESSION: 4 mm left L4-5 synovial cyst abutting the descending L5 nerve root.
Mild left neural foraminal narrowing at this level.

Mild spinal canal and bilateral neural foraminal narrowing at the
L2-3, L5-S1 level.

Mild bilateral L3-4 neural foraminal narrowing.

L5-S1 pars defects with unchanged grade 2 anterolisthesis.

## 2021-04-19 DIAGNOSIS — M222X2 Patellofemoral disorders, left knee: Secondary | ICD-10-CM | POA: Diagnosis not present

## 2021-04-19 DIAGNOSIS — M25561 Pain in right knee: Secondary | ICD-10-CM | POA: Diagnosis not present

## 2021-04-19 DIAGNOSIS — M222X1 Patellofemoral disorders, right knee: Secondary | ICD-10-CM | POA: Diagnosis not present

## 2021-04-19 DIAGNOSIS — M2351 Chronic instability of knee, right knee: Secondary | ICD-10-CM | POA: Diagnosis not present

## 2021-04-19 DIAGNOSIS — M1711 Unilateral primary osteoarthritis, right knee: Secondary | ICD-10-CM | POA: Diagnosis not present

## 2021-04-19 DIAGNOSIS — M25562 Pain in left knee: Secondary | ICD-10-CM | POA: Diagnosis not present

## 2021-04-19 DIAGNOSIS — M1712 Unilateral primary osteoarthritis, left knee: Secondary | ICD-10-CM | POA: Diagnosis not present

## 2021-04-19 DIAGNOSIS — M25761 Osteophyte, right knee: Secondary | ICD-10-CM | POA: Diagnosis not present

## 2021-04-19 DIAGNOSIS — M170 Bilateral primary osteoarthritis of knee: Secondary | ICD-10-CM | POA: Diagnosis not present

## 2021-04-19 DIAGNOSIS — M25762 Osteophyte, left knee: Secondary | ICD-10-CM | POA: Diagnosis not present

## 2021-05-04 DIAGNOSIS — Z20822 Contact with and (suspected) exposure to covid-19: Secondary | ICD-10-CM | POA: Diagnosis not present

## 2021-06-06 DIAGNOSIS — M549 Dorsalgia, unspecified: Secondary | ICD-10-CM | POA: Diagnosis not present

## 2021-06-06 DIAGNOSIS — M5442 Lumbago with sciatica, left side: Secondary | ICD-10-CM | POA: Diagnosis not present

## 2021-07-12 DIAGNOSIS — M4317 Spondylolisthesis, lumbosacral region: Secondary | ICD-10-CM | POA: Diagnosis not present

## 2021-07-12 DIAGNOSIS — M4802 Spinal stenosis, cervical region: Secondary | ICD-10-CM | POA: Diagnosis not present

## 2021-07-12 DIAGNOSIS — Z6826 Body mass index (BMI) 26.0-26.9, adult: Secondary | ICD-10-CM | POA: Diagnosis not present

## 2021-07-31 DIAGNOSIS — M5416 Radiculopathy, lumbar region: Secondary | ICD-10-CM | POA: Diagnosis not present

## 2021-08-01 DIAGNOSIS — L731 Pseudofolliculitis barbae: Secondary | ICD-10-CM | POA: Diagnosis not present

## 2021-08-01 DIAGNOSIS — L738 Other specified follicular disorders: Secondary | ICD-10-CM | POA: Diagnosis not present

## 2021-08-28 DIAGNOSIS — M5416 Radiculopathy, lumbar region: Secondary | ICD-10-CM | POA: Diagnosis not present

## 2021-09-11 DIAGNOSIS — Z8546 Personal history of malignant neoplasm of prostate: Secondary | ICD-10-CM | POA: Diagnosis not present

## 2021-09-11 DIAGNOSIS — E782 Mixed hyperlipidemia: Secondary | ICD-10-CM | POA: Diagnosis not present

## 2021-09-11 DIAGNOSIS — M4316 Spondylolisthesis, lumbar region: Secondary | ICD-10-CM | POA: Diagnosis not present

## 2021-09-11 DIAGNOSIS — K219 Gastro-esophageal reflux disease without esophagitis: Secondary | ICD-10-CM | POA: Diagnosis not present

## 2021-09-11 DIAGNOSIS — M5412 Radiculopathy, cervical region: Secondary | ICD-10-CM | POA: Diagnosis not present

## 2021-09-11 DIAGNOSIS — Z23 Encounter for immunization: Secondary | ICD-10-CM | POA: Diagnosis not present

## 2021-09-11 DIAGNOSIS — R252 Cramp and spasm: Secondary | ICD-10-CM | POA: Diagnosis not present

## 2021-09-11 DIAGNOSIS — M545 Low back pain, unspecified: Secondary | ICD-10-CM | POA: Diagnosis not present

## 2021-09-11 DIAGNOSIS — Z1389 Encounter for screening for other disorder: Secondary | ICD-10-CM | POA: Diagnosis not present

## 2021-09-11 DIAGNOSIS — Z1331 Encounter for screening for depression: Secondary | ICD-10-CM | POA: Diagnosis not present

## 2021-09-11 DIAGNOSIS — Z0000 Encounter for general adult medical examination without abnormal findings: Secondary | ICD-10-CM | POA: Diagnosis not present

## 2021-11-01 DIAGNOSIS — K219 Gastro-esophageal reflux disease without esophagitis: Secondary | ICD-10-CM | POA: Diagnosis not present

## 2021-11-01 DIAGNOSIS — Z8546 Personal history of malignant neoplasm of prostate: Secondary | ICD-10-CM | POA: Diagnosis not present

## 2021-11-01 DIAGNOSIS — E782 Mixed hyperlipidemia: Secondary | ICD-10-CM | POA: Diagnosis not present

## 2021-11-02 DIAGNOSIS — M542 Cervicalgia: Secondary | ICD-10-CM | POA: Diagnosis not present

## 2021-11-02 DIAGNOSIS — R030 Elevated blood-pressure reading, without diagnosis of hypertension: Secondary | ICD-10-CM | POA: Diagnosis not present

## 2021-11-02 DIAGNOSIS — M5412 Radiculopathy, cervical region: Secondary | ICD-10-CM | POA: Diagnosis not present

## 2021-11-02 DIAGNOSIS — M4802 Spinal stenosis, cervical region: Secondary | ICD-10-CM | POA: Diagnosis not present

## 2021-11-02 DIAGNOSIS — Z6827 Body mass index (BMI) 27.0-27.9, adult: Secondary | ICD-10-CM | POA: Diagnosis not present

## 2022-12-20 DIAGNOSIS — M5416 Radiculopathy, lumbar region: Secondary | ICD-10-CM | POA: Diagnosis not present

## 2022-12-20 DIAGNOSIS — Z6826 Body mass index (BMI) 26.0-26.9, adult: Secondary | ICD-10-CM | POA: Diagnosis not present

## 2022-12-24 DIAGNOSIS — N5201 Erectile dysfunction due to arterial insufficiency: Secondary | ICD-10-CM | POA: Diagnosis not present

## 2022-12-24 DIAGNOSIS — Z8546 Personal history of malignant neoplasm of prostate: Secondary | ICD-10-CM | POA: Diagnosis not present

## 2023-04-03 ENCOUNTER — Emergency Department (HOSPITAL_BASED_OUTPATIENT_CLINIC_OR_DEPARTMENT_OTHER): Payer: Medicare PPO | Admitting: Radiology

## 2023-04-03 ENCOUNTER — Encounter (HOSPITAL_BASED_OUTPATIENT_CLINIC_OR_DEPARTMENT_OTHER): Payer: Self-pay

## 2023-04-03 ENCOUNTER — Emergency Department (HOSPITAL_BASED_OUTPATIENT_CLINIC_OR_DEPARTMENT_OTHER)
Admission: EM | Admit: 2023-04-03 | Discharge: 2023-04-03 | Disposition: A | Discharge status: Home / Self Care | Payer: Medicare PPO | Attending: Emergency Medicine | Admitting: Emergency Medicine

## 2023-04-03 ENCOUNTER — Other Ambulatory Visit: Payer: Self-pay

## 2023-04-03 DIAGNOSIS — R55 Syncope and collapse: Secondary | ICD-10-CM | POA: Diagnosis not present

## 2023-04-03 DIAGNOSIS — R110 Nausea: Secondary | ICD-10-CM | POA: Insufficient documentation

## 2023-04-03 DIAGNOSIS — Z8546 Personal history of malignant neoplasm of prostate: Secondary | ICD-10-CM | POA: Diagnosis not present

## 2023-04-03 DIAGNOSIS — R0789 Other chest pain: Secondary | ICD-10-CM | POA: Diagnosis not present

## 2023-04-03 DIAGNOSIS — R42 Dizziness and giddiness: Secondary | ICD-10-CM | POA: Insufficient documentation

## 2023-04-03 DIAGNOSIS — R079 Chest pain, unspecified: Secondary | ICD-10-CM | POA: Diagnosis not present

## 2023-04-03 LAB — CBC
HCT: 43.8 % (ref 39.0–52.0)
Hemoglobin: 15 g/dL (ref 13.0–17.0)
MCH: 29.5 pg (ref 26.0–34.0)
MCHC: 34.2 g/dL (ref 30.0–36.0)
MCV: 86.1 fL (ref 80.0–100.0)
Platelets: 213 10*3/uL (ref 150–400)
RBC: 5.09 MIL/uL (ref 4.22–5.81)
RDW: 13.5 % (ref 11.5–15.5)
WBC: 6.3 10*3/uL (ref 4.0–10.5)
nRBC: 0 % (ref 0.0–0.2)

## 2023-04-03 LAB — BASIC METABOLIC PANEL
Anion gap: 10 (ref 5–15)
BUN: 12 mg/dL (ref 8–23)
CO2: 26 mmol/L (ref 22–32)
Calcium: 9.4 mg/dL (ref 8.9–10.3)
Chloride: 105 mmol/L (ref 98–111)
Creatinine, Ser: 0.93 mg/dL (ref 0.61–1.24)
GFR, Estimated: >60 mL/min (ref >60)
Glucose, Bld: 84 mg/dL (ref 70–99)
Potassium: 4.5 mmol/L (ref 3.5–5.1)
Sodium: 141 mmol/L (ref 135–145)

## 2023-04-03 LAB — TROPONIN I (HIGH SENSITIVITY)
Troponin I (High Sensitivity): 2 ng/L (ref <18)
Troponin I (High Sensitivity): 2 ng/L (ref <18)

## 2023-04-03 LAB — D-DIMER, QUANTITATIVE: D-Dimer, Quant: <0.27 ug/mL-FEU (ref 0.00–0.50)

## 2023-04-03 MED ORDER — DICLOFENAC SODIUM 1 % EX GEL: 2.0000 g | Freq: Four times a day (QID) | CUTANEOUS | 0 refills | Status: AC | PRN

## 2023-04-03 NOTE — ED Notes (Signed)
Discharge instructions provided by edp were discussed with pt. S/o at bedside. Pt was able to verbalize understanding with no additional questions at this time. Pt ambulatory at discharge.

## 2023-04-03 NOTE — Discharge Instructions (Signed)

## 2023-04-03 NOTE — ED Provider Notes (Signed)
Emergency Department Provider Note   I have reviewed the triage vital signs and the nursing notes.   HISTORY  Chief Complaint Chest Pain   HPI Garrett Wilson is a 67 y.o. male with past history reviewed below presents emergency department for evaluation of sharp, stabbing chest pain.  Symptoms have been intermittent over the past 4 days without clear provoking factor.  He states today the pain was more severe along with some mild nausea and lightheadedness at times.  No active pain currently.  He has started a new workout routine including lifting weights and cycling.  Pain is somewhat worse with touching the area and movement. No leg swelling or pain.   Past Medical History:  Diagnosis Date   Arthritis    Back pain, chronic    Headache(784.0)    HOH (hard of hearing)    Insomnia    Prostate cancer ([facility])    2010    Review of Systems  Constitutional: No fever/chills Cardiovascular: Positive chest pain. Respiratory: Denies shortness of breath. Gastrointestinal: No abdominal pain.  Positive nausea, no vomiting.  No diarrhea.  No constipation. Genitourinary: Negative for dysuria. Musculoskeletal: Negative for back pain. Skin: Negative for rash. Neurological: Negative for headaches, focal weakness or numbness.  ____________________________________________   PHYSICAL EXAM:  VITAL SIGNS: ED Triage Vitals  Enc Vitals Group     BP 04/03/23 1701 138/88     Pulse Rate 04/03/23 1701 66     Resp 04/03/23 1701 18     Temp 04/03/23 1701 98 F (36.7 C)     Temp Source 04/03/23 1701 Temporal     SpO2 04/03/23 1701 99 %     Weight 04/03/23 1659 199 lb (90.3 kg)     Height 04/03/23 1659 6' (1.829 m)   Constitutional: Alert and oriented. Well appearing and in no acute distress. Eyes: Conjunctivae are normal.  Head: Atraumatic. Nose: No congestion/rhinnorhea. Mouth/Throat: Mucous membranes are moist.   Neck: No stridor.   Cardiovascular: Normal rate, regular rhythm.  Good peripheral circulation. Grossly normal heart sounds.   Respiratory: Normal respiratory effort.  No retractions. Lungs CTAB. Gastrointestinal: Soft and nontender. No distention.  Musculoskeletal: No lower extremity tenderness nor edema. No gross deformities of extremities.  Tenderness to palpation along the left sternal border and left superior pectoralis muscle reproducing the patient's discomfort.  Neurologic:  Normal speech and language.  Skin:  Skin is warm, dry and intact. No rash noted.   ____________________________________________   LABS (all labs ordered are listed, but only abnormal results are displayed)  Labs Reviewed  BASIC METABOLIC PANEL  CBC  D-DIMER, QUANTITATIVE  TROPONIN I (HIGH SENSITIVITY)  TROPONIN I (HIGH SENSITIVITY)   ____________________________________________  EKG   EKG Interpretation  Date/Time:  Wednesday Apr 03 2023 17:01:13 EDT Ventricular Rate:  69 PR Interval:  206 QRS Duration: 86 QT Interval:  398 QTC Calculation: 426 R Axis:   24 Text Interpretation: Normal sinus rhythm Nonspecific T wave abnormality Abnormal ECG When compared with ECG of 18-Nov-2008 09:11, Nonspecific T wave abnormality now evident in Lateral leads Confirmed by Alona Bene [phone]) on 04/03/2023 5:14:12 PM        ____________________________________________  RADIOLOGY  DG Chest 2 View  Result Date: 04/03/2023 CLINICAL DATA:  Chest pain EXAM: CHEST - 2 VIEW COMPARISON:  10/13/2018 FINDINGS: Stable cardiomediastinal silhouette. No focal consolidation, pleural effusion, or pneumothorax. No displaced rib fractures. Elevated left hemidiaphragm. IMPRESSION: No active cardiopulmonary disease. Electronically Signed   By: Minerva Fester  M.D.   On: 04/03/2023 17:29    ____________________________________________   PROCEDURES  Procedure(s) performed:   Procedures  none ____________________________________________   INITIAL IMPRESSION / ASSESSMENT AND PLAN / ED  COURSE  Pertinent labs & imaging results that were available during my care of the patient were reviewed by me and considered in my medical decision making (see chart for details).   This patient is Presenting for Evaluation of CP, which does require a range of treatment options, and is a complaint that involves a high risk of morbidity and mortality.  The Differential Diagnoses includes but is not exclusive to acute coronary syndrome, aortic dissection, pulmonary embolism, cardiac tamponade, community-acquired pneumonia, pericarditis, musculoskeletal chest wall pain, etc.  I did obtain Additional Historical Information from sister and wife at bedside.   I decided to review pertinent External Data, and in summary no recent heart cath or ECHO/stress test to review.   Clinical Laboratory Tests Ordered, included Troponin negative x 2. D dimer negative. No AKI.   Radiologic Tests Ordered, included CXR. I independently interpreted the images and agree with radiology interpretation.   Cardiac Monitor Tracing which shows NSR.    Social Determinants of Health Risk patient is a non-smoker.    Medical Decision Making: Summary:  Presents emergency department evaluation of chest pain.  Pain is fairly atypical for ACS and somewhat reproducible on exam.  D-dimer added given the atypical quality of this pain and is negative.  Overall low risk for VTE.  Do not plan for CT imaging of the chest.   Reevaluation with update and discussion with patient and wife. troponin negative x 2. Negative d dimer.   Considered admission but pain is fairly atypical and labs are reassuring. Seems most consistent with MSK pain.   Patient's presentation is most consistent with acute presentation with potential threat to life or bodily function.   Disposition: discahrge  ____________________________________________  FINAL CLINICAL IMPRESSION(S) / ED DIAGNOSES  Final diagnoses:  Atypical chest pain     NEW  OUTPATIENT MEDICATIONS STARTED DURING THIS VISIT:  Discharge Medication List as of 04/03/2023  8:49 PM     START taking these medications   Details  diclofenac Sodium (VOLTAREN) 1 % GEL Apply 2 g topically 4 (four) times daily as needed., Starting Wed 04/03/2023, Normal        Note:  This document was prepared using Dragon voice recognition software and may include unintentional dictation errors.  Alona Bene, MD, [facility] Emergency Medicine    Simmone Cape, Arlyss Repress, MD 04/04/23 0900

## 2023-04-03 NOTE — ED Triage Notes (Signed)
Patient here POV from Home.  Endorses CP for a few days. Worsened since. Some Dizziness and Nausea. No emesis. No SOB.  NAD Noted during Triage. A&Ox4. Gcs 15. BIB Wheelchair.

## 2023-04-08 DIAGNOSIS — Z86010 Personal history of colonic polyps: Secondary | ICD-10-CM | POA: Diagnosis not present

## 2023-04-08 DIAGNOSIS — D123 Benign neoplasm of transverse colon: Secondary | ICD-10-CM | POA: Diagnosis not present

## 2023-04-08 DIAGNOSIS — D122 Benign neoplasm of ascending colon: Secondary | ICD-10-CM | POA: Diagnosis not present

## 2023-04-08 DIAGNOSIS — K644 Residual hemorrhoidal skin tags: Secondary | ICD-10-CM | POA: Diagnosis not present

## 2023-04-08 DIAGNOSIS — K648 Other hemorrhoids: Secondary | ICD-10-CM | POA: Diagnosis not present

## 2023-04-08 DIAGNOSIS — Z09 Encounter for follow-up examination after completed treatment for conditions other than malignant neoplasm: Secondary | ICD-10-CM | POA: Diagnosis not present

## 2023-04-10 DIAGNOSIS — D123 Benign neoplasm of transverse colon: Secondary | ICD-10-CM | POA: Diagnosis not present

## 2023-05-29 DIAGNOSIS — M4317 Spondylolisthesis, lumbosacral region: Secondary | ICD-10-CM | POA: Diagnosis not present

## 2023-05-29 DIAGNOSIS — M5416 Radiculopathy, lumbar region: Secondary | ICD-10-CM | POA: Diagnosis not present

## 2023-06-11 DIAGNOSIS — M5416 Radiculopathy, lumbar region: Secondary | ICD-10-CM | POA: Diagnosis not present

## 2023-10-02 DIAGNOSIS — G319 Degenerative disease of nervous system, unspecified: Secondary | ICD-10-CM | POA: Diagnosis not present

## 2023-10-02 DIAGNOSIS — R3915 Urgency of urination: Secondary | ICD-10-CM | POA: Diagnosis not present

## 2023-10-02 DIAGNOSIS — Z9181 History of falling: Secondary | ICD-10-CM | POA: Diagnosis not present

## 2023-10-02 DIAGNOSIS — Z1331 Encounter for screening for depression: Secondary | ICD-10-CM | POA: Diagnosis not present

## 2023-10-02 DIAGNOSIS — Z79899 Other long term (current) drug therapy: Secondary | ICD-10-CM | POA: Diagnosis not present

## 2023-10-02 DIAGNOSIS — N529 Male erectile dysfunction, unspecified: Secondary | ICD-10-CM | POA: Diagnosis not present

## 2023-10-02 DIAGNOSIS — Z125 Encounter for screening for malignant neoplasm of prostate: Secondary | ICD-10-CM | POA: Diagnosis not present

## 2023-10-02 DIAGNOSIS — K219 Gastro-esophageal reflux disease without esophagitis: Secondary | ICD-10-CM | POA: Diagnosis not present

## 2023-10-02 DIAGNOSIS — F4310 Post-traumatic stress disorder, unspecified: Secondary | ICD-10-CM | POA: Diagnosis not present

## 2023-10-02 DIAGNOSIS — Z0000 Encounter for general adult medical examination without abnormal findings: Secondary | ICD-10-CM | POA: Diagnosis not present

## 2023-10-02 DIAGNOSIS — E782 Mixed hyperlipidemia: Secondary | ICD-10-CM | POA: Diagnosis not present

## 2023-10-02 DIAGNOSIS — R7303 Prediabetes: Secondary | ICD-10-CM | POA: Diagnosis not present

## 2023-10-03 DIAGNOSIS — R7303 Prediabetes: Secondary | ICD-10-CM | POA: Diagnosis not present

## 2023-10-03 DIAGNOSIS — E782 Mixed hyperlipidemia: Secondary | ICD-10-CM | POA: Diagnosis not present

## 2023-10-03 DIAGNOSIS — Z79899 Other long term (current) drug therapy: Secondary | ICD-10-CM | POA: Diagnosis not present

## 2023-11-20 DIAGNOSIS — M4317 Spondylolisthesis, lumbosacral region: Secondary | ICD-10-CM | POA: Diagnosis not present

## 2023-11-20 DIAGNOSIS — Z6826 Body mass index (BMI) 26.0-26.9, adult: Secondary | ICD-10-CM | POA: Diagnosis not present

## 2023-11-29 DIAGNOSIS — J208 Acute bronchitis due to other specified organisms: Secondary | ICD-10-CM | POA: Diagnosis not present

## 2023-12-16 DIAGNOSIS — C61 Malignant neoplasm of prostate: Secondary | ICD-10-CM | POA: Diagnosis not present

## 2023-12-16 DIAGNOSIS — Z8546 Personal history of malignant neoplasm of prostate: Secondary | ICD-10-CM | POA: Diagnosis not present

## 2023-12-23 DIAGNOSIS — Z8546 Personal history of malignant neoplasm of prostate: Secondary | ICD-10-CM | POA: Diagnosis not present

## 2023-12-23 DIAGNOSIS — N5201 Erectile dysfunction due to arterial insufficiency: Secondary | ICD-10-CM | POA: Diagnosis not present

## 2024-01-03 DIAGNOSIS — Z8546 Personal history of malignant neoplasm of prostate: Secondary | ICD-10-CM | POA: Diagnosis not present

## 2024-01-03 DIAGNOSIS — N5201 Erectile dysfunction due to arterial insufficiency: Secondary | ICD-10-CM | POA: Diagnosis not present

## 2024-01-20 DIAGNOSIS — M5410 Radiculopathy, site unspecified: Secondary | ICD-10-CM | POA: Diagnosis not present

## 2024-04-28 DIAGNOSIS — M67912 Unspecified disorder of synovium and tendon, left shoulder: Secondary | ICD-10-CM | POA: Diagnosis not present

## 2024-05-20 DIAGNOSIS — M25512 Pain in left shoulder: Secondary | ICD-10-CM | POA: Diagnosis not present

## 2024-05-20 DIAGNOSIS — M5412 Radiculopathy, cervical region: Secondary | ICD-10-CM | POA: Diagnosis not present

## 2024-06-10 DIAGNOSIS — M25512 Pain in left shoulder: Secondary | ICD-10-CM | POA: Diagnosis not present

## 2024-10-07 DIAGNOSIS — K219 Gastro-esophageal reflux disease without esophagitis: Secondary | ICD-10-CM | POA: Diagnosis not present

## 2024-10-07 DIAGNOSIS — Z125 Encounter for screening for malignant neoplasm of prostate: Secondary | ICD-10-CM | POA: Diagnosis not present

## 2024-10-07 DIAGNOSIS — Z8546 Personal history of malignant neoplasm of prostate: Secondary | ICD-10-CM | POA: Diagnosis not present

## 2024-10-07 DIAGNOSIS — Z79899 Other long term (current) drug therapy: Secondary | ICD-10-CM | POA: Diagnosis not present

## 2024-10-07 DIAGNOSIS — R7303 Prediabetes: Secondary | ICD-10-CM | POA: Diagnosis not present

## 2024-10-07 DIAGNOSIS — E782 Mixed hyperlipidemia: Secondary | ICD-10-CM | POA: Diagnosis not present

## 2024-10-07 DIAGNOSIS — Z0000 Encounter for general adult medical examination without abnormal findings: Secondary | ICD-10-CM | POA: Diagnosis not present

## 2024-10-07 DIAGNOSIS — F4310 Post-traumatic stress disorder, unspecified: Secondary | ICD-10-CM | POA: Diagnosis not present

## 2024-10-07 DIAGNOSIS — Z23 Encounter for immunization: Secondary | ICD-10-CM | POA: Diagnosis not present

## 2024-10-07 DIAGNOSIS — N529 Male erectile dysfunction, unspecified: Secondary | ICD-10-CM | POA: Diagnosis not present

## 2024-10-07 DIAGNOSIS — R600 Localized edema: Secondary | ICD-10-CM | POA: Diagnosis not present

## 2024-10-07 DIAGNOSIS — G319 Degenerative disease of nervous system, unspecified: Secondary | ICD-10-CM | POA: Diagnosis not present

## 2024-10-07 DIAGNOSIS — Z1211 Encounter for screening for malignant neoplasm of colon: Secondary | ICD-10-CM | POA: Diagnosis not present

## 2024-10-07 DIAGNOSIS — Z1331 Encounter for screening for depression: Secondary | ICD-10-CM | POA: Diagnosis not present

## 2024-10-07 DIAGNOSIS — E559 Vitamin D deficiency, unspecified: Secondary | ICD-10-CM | POA: Diagnosis not present

## 2024-12-24 ENCOUNTER — Other Ambulatory Visit (HOSPITAL_COMMUNITY): Payer: Self-pay | Admitting: Internal Medicine

## 2024-12-24 DIAGNOSIS — R600 Localized edema: Secondary | ICD-10-CM

## 2024-12-25 ENCOUNTER — Ambulatory Visit (HOSPITAL_COMMUNITY)
Admission: RE | Admit: 2024-12-25 | Discharge: 2024-12-25 | Disposition: A | Source: Ambulatory Visit | Attending: Internal Medicine | Admitting: Internal Medicine

## 2024-12-25 DIAGNOSIS — R600 Localized edema: Secondary | ICD-10-CM | POA: Diagnosis present

## 2024-12-25 LAB — ECHOCARDIOGRAM COMPLETE
Area-P 1/2: 3.12 cm2
S' Lateral: 2.1 cm
